# Patient Record
Sex: Female | Born: 1990
Health system: Southern US, Community
[De-identification: ages and names within clinical notes are randomized; demographics above are authoritative.]

## PROBLEM LIST (undated history)

## (undated) DIAGNOSIS — E010 Iodine-deficiency related diffuse (endemic) goiter: Secondary | ICD-10-CM

## (undated) HISTORY — DX: Iodine-deficiency related diffuse (endemic) goiter: E01.0

## (undated) HISTORY — PX: KNEE CARTILAGE SURGERY: SHX688

---

## 2011-05-28 ENCOUNTER — Encounter: Payer: Self-pay | Admitting: *Deleted

## 2011-05-28 ENCOUNTER — Emergency Department (HOSPITAL_COMMUNITY)
Admission: EM | Admit: 2011-05-28 | Discharge: 2011-05-28 | Disposition: A | Discharge status: Home / Self Care | Attending: Emergency Medicine | Admitting: Emergency Medicine

## 2011-05-28 DIAGNOSIS — F1010 Alcohol abuse, uncomplicated: Secondary | ICD-10-CM | POA: Insufficient documentation

## 2011-05-28 DIAGNOSIS — F10929 Alcohol use, unspecified with intoxication, unspecified: Secondary | ICD-10-CM

## 2011-05-28 DIAGNOSIS — R112 Nausea with vomiting, unspecified: Secondary | ICD-10-CM | POA: Insufficient documentation

## 2011-05-28 LAB — URINALYSIS, ROUTINE W REFLEX MICROSCOPIC
Glucose, UA: NEGATIVE mg/dL
Leukocytes, UA: NEGATIVE
Nitrite: NEGATIVE
Specific Gravity, Urine: 1.019 (ref 1.005–1.030)
pH: 6 (ref 5.0–8.0)

## 2011-05-28 LAB — POCT I-STAT, CHEM 8
Chloride: 113 mEq/L — ABNORMAL HIGH (ref 96–112)
Glucose, Bld: 81 mg/dL (ref 70–99)
HCT: 34 % — ABNORMAL LOW (ref 36.0–46.0)
Hemoglobin: 11.6 g/dL — ABNORMAL LOW (ref 12.0–15.0)
Potassium: 3.9 mEq/L (ref 3.5–5.1)
Sodium: 144 mEq/L (ref 135–145)

## 2011-05-28 LAB — POCT PREGNANCY, URINE: Preg Test, Ur: NEGATIVE

## 2011-05-28 MED ORDER — PROMETHAZINE HCL 25 MG PO TABS: 25.0000 mg | ORAL_TABLET | Freq: Three times a day (TID) | ORAL | Status: AC | PRN

## 2011-05-28 MED ORDER — ONDANSETRON 8 MG PO TBDP
8.0000 mg | ORAL_TABLET | Freq: Once | ORAL | Status: AC
  Administered 2011-05-28: 8 mg via ORAL
  Filled 2011-05-28: qty 1

## 2011-05-28 MED ORDER — ONDANSETRON HCL 4 MG/2ML IJ SOLN
4.0000 mg | Freq: Once | INTRAMUSCULAR | Status: AC
  Administered 2011-05-28: 4 mg via INTRAVENOUS
  Filled 2011-05-28: qty 2

## 2011-05-28 MED ORDER — SODIUM CHLORIDE 0.9 % IV BOLUS (SEPSIS)
1000.0000 mL | Freq: Once | INTRAVENOUS | Status: AC
  Administered 2011-05-28: 1000 mL via INTRAVENOUS

## 2011-05-28 NOTE — ED Notes (Signed)
Bathing Supplies Were Given 

## 2011-05-28 NOTE — ED Notes (Signed)
WJX:[id number]<WG> Expected date:05/28/11<BR> Expected time:12:48 AM<BR> Means of arrival:Ambulance<BR> Comments:<BR> etoh

## 2011-05-28 NOTE — ED Provider Notes (Signed)
History     CSN: [id number] Arrival date & time: 05/28/2011  1:16 AM   First MD Initiated Contact with Patient 05/28/11 [phone]      Chief Complaint  Patient presents with  . Nausea    pt recieved 4 zofran per ems.     (Consider location/radiation/quality/duration/timing/severity/associated sxs/prior treatment) The history is provided by the patient.   patient comes in after alcohol intoxication on her 20st birthday.  She began to have some nausea and vomiting after heavy alcohol consumption.  Patient has no complaints other than the nausea and vomiting.  She denies abdominal pain, shortness of breath, chest pain, headache, visual changes, or weakness.  History reviewed. No pertinent past medical history.  Past Surgical History  Procedure Date  . Knee     History reviewed. No pertinent family history.  History  Substance Use Topics  . Smoking status: Not on file  . Smokeless tobacco: Not on file  . Alcohol Use: Yes    OB History    Grav Para Term Preterm Abortions TAB SAB Ect Mult Living                  Review of Systems All pertinent positives and negatives in the history of present illness  Allergies  Review of patient's allergies indicates no known allergies.  Home Medications   Current Outpatient Rx  Name Route Sig Dispense Refill  . NORETHINDRONE-ETH ESTRADIOL 1-35 MG-MCG PO TABS Oral Take 1 tablet by mouth daily.        BP 88/41  Pulse 90  Temp(Src) 97.5 F (36.4 C) (Oral)  Resp 15  Ht 5\' 6"  (1.676 m)  Wt 150 lb (68.04 kg)  BMI 24.21 kg/m2  SpO2 98%  Physical Exam  Constitutional: She appears well-developed and well-nourished. No distress.  HENT:  Head: Normocephalic and atraumatic.  Eyes: Pupils are equal, round, and reactive to light.  Neck: Normal range of motion. Neck supple.  Cardiovascular: Normal rate, regular rhythm and normal heart sounds.  Exam reveals no gallop and no friction rub.   No murmur heard. Pulmonary/Chest: Effort normal  and breath sounds normal. No respiratory distress. She has no wheezes. She has no rales.  Abdominal: Soft. Bowel sounds are normal.  Neurological: She is alert.  Skin: Skin is warm and dry. No rash noted. No erythema.    ED Course  Procedures (including critical care time)     Patient has been ambulating around the department and feeling better.  She will be given antiemetics for home.  He is advised to increase her fluids with Gatorade type liquids.      MDM   Alcohol intoxication, nausea, and vomiting.       Carlyle Dolly, [place] 05/28/11 [phone]

## 2011-05-28 NOTE — ED Provider Notes (Signed)
Medical screening examination/treatment/procedure(s) were performed by non-physician practitioner and as supervising physician I was immediately available for consultation/collaboration.  Cydney Alvarenga M Trust Leh, MD 05/28/11 1744 

## 2011-05-28 NOTE — ED Notes (Signed)
Pt c/o N/V starting at 2300 last pm, last vomit was approx 0030, vomit x4-5, pt turned 20 yesterday and drank an unknown amt of etoh, pt sts "I never drink."

## 2011-05-28 NOTE — ED Notes (Signed)
Pt taken to room 2 and made comfortable.

## 2011-10-06 ENCOUNTER — Ambulatory Visit (INDEPENDENT_AMBULATORY_CARE_PROVIDER_SITE_OTHER): Admitting: Family Medicine

## 2011-10-06 VITALS — BP 121/79 | HR 76 | Temp 98.3°F | Resp 16 | Ht 66.5 in | Wt 161.0 lb

## 2011-10-06 DIAGNOSIS — IMO0002 Reserved for concepts with insufficient information to code with codable children: Secondary | ICD-10-CM

## 2011-10-06 DIAGNOSIS — L723 Sebaceous cyst: Secondary | ICD-10-CM

## 2011-10-06 MED ORDER — MELOXICAM 7.5 MG PO TABS: 7.5000 mg | ORAL_TABLET | Freq: Two times a day (BID) | ORAL | Status: AC

## 2011-10-06 NOTE — Progress Notes (Signed)
  Subjective:    Patient ID: Kendra Boone, female    DOB: 21-May-1991, 21 y.o.   MRN: [id number]  HPI Patient presents complaining of bump on scalp after getting hair cut on Saturday. Skin over bump tender and patient experiences twinge in her head when she presses on bump   Review of Systems     Objective:   Physical Exam  Constitutional: She appears well-developed.  Neck: Neck supple.  Cardiovascular: Normal rate, regular rhythm and normal heart sounds.   Pulmonary/Chest: Effort normal and breath sounds normal.  Skin:       Small fluctuant nonadherent lesion (L) parietal region          Assessment & Plan:  . 1. Simple cyst of scalp  meloxicam (MOBIC) 7.5 MG tablet

## 2015-08-09 ENCOUNTER — Ambulatory Visit (INDEPENDENT_AMBULATORY_CARE_PROVIDER_SITE_OTHER): Payer: Managed Care, Other (non HMO) | Admitting: Physician Assistant

## 2015-08-09 ENCOUNTER — Encounter: Payer: Self-pay | Admitting: Physician Assistant

## 2015-08-09 VITALS — BP 135/67 | HR 78 | Ht 66.0 in | Wt 179.0 lb

## 2015-08-09 DIAGNOSIS — E049 Nontoxic goiter, unspecified: Secondary | ICD-10-CM

## 2015-08-09 DIAGNOSIS — Z23 Encounter for immunization: Secondary | ICD-10-CM | POA: Diagnosis not present

## 2015-08-09 DIAGNOSIS — M25552 Pain in left hip: Secondary | ICD-10-CM

## 2015-08-09 DIAGNOSIS — E010 Iodine-deficiency related diffuse (endemic) goiter: Secondary | ICD-10-CM

## 2015-08-09 DIAGNOSIS — R1310 Dysphagia, unspecified: Secondary | ICD-10-CM

## 2015-08-09 DIAGNOSIS — M25562 Pain in left knee: Secondary | ICD-10-CM

## 2015-08-09 DIAGNOSIS — N9489 Other specified conditions associated with female genital organs and menstrual cycle: Secondary | ICD-10-CM | POA: Diagnosis not present

## 2015-08-09 DIAGNOSIS — M25561 Pain in right knee: Secondary | ICD-10-CM

## 2015-08-09 DIAGNOSIS — F32A Depression, unspecified: Secondary | ICD-10-CM

## 2015-08-09 DIAGNOSIS — N898 Other specified noninflammatory disorders of vagina: Secondary | ICD-10-CM

## 2015-08-09 DIAGNOSIS — R635 Abnormal weight gain: Secondary | ICD-10-CM

## 2015-08-09 DIAGNOSIS — F329 Major depressive disorder, single episode, unspecified: Secondary | ICD-10-CM | POA: Diagnosis not present

## 2015-08-09 DIAGNOSIS — R601 Generalized edema: Secondary | ICD-10-CM

## 2015-08-09 DIAGNOSIS — R682 Dry mouth, unspecified: Secondary | ICD-10-CM

## 2015-08-09 DIAGNOSIS — M25551 Pain in right hip: Secondary | ICD-10-CM

## 2015-08-09 LAB — WET PREP FOR TRICH, YEAST, CLUE
Trich, Wet Prep: NONE SEEN
Yeast Wet Prep HPF POC: NONE SEEN

## 2015-08-10 LAB — FERRITIN: FERRITIN: 23 ng/mL (ref 10–154)

## 2015-08-10 LAB — T4, FREE: Free T4: 1.2 ng/dL (ref 0.8–1.8)

## 2015-08-10 LAB — COMPREHENSIVE METABOLIC PANEL
ALBUMIN: 3.9 g/dL (ref 3.6–5.1)
ALT: 12 U/L (ref 6–29)
AST: 15 U/L (ref 10–30)
Alkaline Phosphatase: 51 U/L (ref 33–115)
BILIRUBIN TOTAL: 0.4 mg/dL (ref 0.2–1.2)
BUN: 10 mg/dL (ref 7–25)
CALCIUM: 9.2 mg/dL (ref 8.6–10.2)
CHLORIDE: 102 mmol/L (ref 98–110)
CO2: 25 mmol/L (ref 20–31)
Creat: 0.75 mg/dL (ref 0.50–1.10)
Glucose, Bld: 79 mg/dL (ref 65–99)
Potassium: 3.5 mmol/L (ref 3.5–5.3)
Sodium: 134 mmol/L — ABNORMAL LOW (ref 135–146)
TOTAL PROTEIN: 7.3 g/dL (ref 6.1–8.1)

## 2015-08-10 LAB — CBC
HEMATOCRIT: 41.2 % (ref 36.0–46.0)
HEMOGLOBIN: 13.8 g/dL (ref 12.0–15.0)
MCH: 30.6 pg (ref 26.0–34.0)
MCHC: 33.5 g/dL (ref 30.0–36.0)
MCV: 91.4 fL (ref 78.0–100.0)
MPV: 13 fL — AB (ref 8.6–12.4)
Platelets: 186 10*3/uL (ref 150–400)
RBC: 4.51 MIL/uL (ref 3.87–5.11)
RDW: 13.7 % (ref 11.5–15.5)
WBC: 6.2 10*3/uL (ref 4.0–10.5)

## 2015-08-10 LAB — T3, FREE: T3, Free: 3.3 pg/mL (ref 2.3–4.2)

## 2015-08-10 LAB — VITAMIN D 25 HYDROXY (VIT D DEFICIENCY, FRACTURES): VIT D 25 HYDROXY: 10 ng/mL — AB (ref 30–100)

## 2015-08-10 LAB — VITAMIN B12: VITAMIN B 12: 241 pg/mL (ref 200–1100)

## 2015-08-10 LAB — C-REACTIVE PROTEIN

## 2015-08-10 LAB — TSH: TSH: 0.73 mIU/L

## 2015-08-10 LAB — SEDIMENTATION RATE: SED RATE: 7 mm/h (ref 0–20)

## 2015-08-12 ENCOUNTER — Encounter: Payer: Self-pay | Admitting: Physician Assistant

## 2015-08-12 DIAGNOSIS — F329 Major depressive disorder, single episode, unspecified: Secondary | ICD-10-CM | POA: Insufficient documentation

## 2015-08-12 DIAGNOSIS — R635 Abnormal weight gain: Secondary | ICD-10-CM | POA: Insufficient documentation

## 2015-08-12 DIAGNOSIS — M25561 Pain in right knee: Secondary | ICD-10-CM | POA: Insufficient documentation

## 2015-08-12 DIAGNOSIS — R1310 Dysphagia, unspecified: Secondary | ICD-10-CM | POA: Insufficient documentation

## 2015-08-12 DIAGNOSIS — E010 Iodine-deficiency related diffuse (endemic) goiter: Secondary | ICD-10-CM | POA: Insufficient documentation

## 2015-08-12 DIAGNOSIS — N898 Other specified noninflammatory disorders of vagina: Secondary | ICD-10-CM | POA: Insufficient documentation

## 2015-08-12 DIAGNOSIS — E871 Hypo-osmolality and hyponatremia: Secondary | ICD-10-CM | POA: Insufficient documentation

## 2015-08-12 DIAGNOSIS — R601 Generalized edema: Secondary | ICD-10-CM | POA: Insufficient documentation

## 2015-08-12 DIAGNOSIS — R682 Dry mouth, unspecified: Secondary | ICD-10-CM | POA: Insufficient documentation

## 2015-08-12 DIAGNOSIS — E559 Vitamin D deficiency, unspecified: Secondary | ICD-10-CM | POA: Insufficient documentation

## 2015-08-12 DIAGNOSIS — F32A Depression, unspecified: Secondary | ICD-10-CM | POA: Insufficient documentation

## 2015-08-12 DIAGNOSIS — R790 Abnormal level of blood mineral: Secondary | ICD-10-CM | POA: Insufficient documentation

## 2015-08-12 DIAGNOSIS — M25551 Pain in right hip: Secondary | ICD-10-CM | POA: Insufficient documentation

## 2015-08-12 DIAGNOSIS — M25552 Pain in left hip: Secondary | ICD-10-CM

## 2015-08-12 DIAGNOSIS — M25562 Pain in left knee: Secondary | ICD-10-CM | POA: Insufficient documentation

## 2015-08-12 LAB — THYROID ANTIBODIES: Thyroperoxidase Ab SerPl-aCnc: 1 IU/mL (ref <9)

## 2015-08-12 MED ORDER — METRONIDAZOLE 500 MG PO TABS: 500.0000 mg | ORAL_TABLET | Freq: Two times a day (BID) | ORAL | Status: DC

## 2015-08-12 MED ORDER — VITAMIN D (ERGOCALCIFEROL) 1.25 MG (50000 UNIT) PO CAPS: 50000.0000 [IU] | ORAL_CAPSULE | ORAL | Status: DC

## 2015-08-12 NOTE — Progress Notes (Signed)
   Subjective:    Patient ID: Kendra Boone, female    DOB: [birth year], 25 y.o.   MRN: [id number]  HPI Pt is a 25 yo female who presents to the clinic to establish care.   PMH negative.  .. Family History  Problem Relation Age of Onset  . Hyperlipidemia Mother   . Hypertension Mother   . Alcohol abuse Father   . Hyperlipidemia Father   . Hypertension Father   . Diabetes Maternal Uncle   . Cancer Maternal Grandmother     ovarian  . Hyperlipidemia Maternal Grandmother   . Hypertension Maternal Grandmother   . Diabetes Maternal Grandfather   . Hypertension Maternal Grandfather   . Diabetes Maternal Uncle    .Marland Kitchen Social History   Social History  . Marital Status: Single    Spouse Name: N/A  . Number of Children: N/A  . Years of Education: N/A   Occupational History  . Not on file.   Social History Main Topics  . Smoking status: Never Smoker   . Smokeless tobacco: Not on file  . Alcohol Use: Yes  . Drug Use: No  . Sexual Activity: Yes   Other Topics Concern  . Not on file   Social History Narrative    Pt has multiple concerns today.  1. Vaginal odor. No real discharge. No itching, burning or pain. Not tried anything to make better.   2. Trouble swallowing for last 6 months. She is also having generalized hand and leg swelling, dry mouth, and depression. She just feels down. She has also gained 25 lbs in last year. Despite working out 3-5 times a week.   3. Bilateral hip pain/bilateral joint pain of both legs for last 6 months. Pain is off an on. Generalized achy feeling. She works out Production manager. That does not help or bother. She occasionally off and on has some leg and hand edema. No injuries or trauma.    Review of Systems  All other systems reviewed and are negative.      Objective:   Physical Exam  Constitutional: She is oriented to person, place, and time. She appears well-developed and well-nourished.  HENT:  Head: Normocephalic and atraumatic.   Right Ear: External ear normal.  Left Ear: External ear normal.  Nose: Nose normal.  Mouth/Throat: Oropharynx is clear and moist. No oropharyngeal exudate.  Eyes: Conjunctivae are normal. Right eye exhibits no discharge. Left eye exhibits no discharge.  Neck:  Diffusely large thyroid enlargement. Non-tender.   Cardiovascular: Normal rate, regular rhythm and normal heart sounds.   Pulmonary/Chest: Effort normal and breath sounds normal.  Neurological: She is alert and oriented to person, place, and time.  Skin: Skin is dry.  Psychiatric: She has a normal mood and affect. Her behavior is normal.          Assessment & Plan:  Thyromegaly/trouble swallowing/dry mouth/generalized swelling- TSH/T4/T3/thyroid antibodies. Will order ultrasound to further evaluate.  Anxiety and Depression- PHQ-9 was 13. GAD-7 was 6. Discussed medication options. She would like to hold until we get thyroid evaluated to see if that could be causing.   Fatigue/abnormal weight gain- fatigue panel ordered. implanon can cause some weight gain. Will follow up.   Bilateral hip pain/arthragia of lower legs- could be due to thyroid issue. Could be some osteoarthritis. Ibuprofen as needed. Will re-evaluate after labs.   Vaginal odor- wet prep stat sent. Positive for clue cells. Sent metronidazole for 7 days.

## 2015-08-15 ENCOUNTER — Ambulatory Visit (INDEPENDENT_AMBULATORY_CARE_PROVIDER_SITE_OTHER): Payer: Managed Care, Other (non HMO)

## 2015-08-15 DIAGNOSIS — E010 Iodine-deficiency related diffuse (endemic) goiter: Secondary | ICD-10-CM

## 2015-08-19 NOTE — Addendum Note (Signed)
Addended by: Donella Stade on: 08/19/2015 04:20 PM   Modules accepted: Orders

## 2015-08-20 ENCOUNTER — Encounter: Payer: Self-pay | Admitting: Internal Medicine

## 2015-10-08 ENCOUNTER — Ambulatory Visit: Payer: Managed Care, Other (non HMO) | Admitting: Internal Medicine

## 2015-11-14 ENCOUNTER — Other Ambulatory Visit: Payer: Self-pay | Admitting: Physician Assistant

## 2015-11-29 ENCOUNTER — Ambulatory Visit (INDEPENDENT_AMBULATORY_CARE_PROVIDER_SITE_OTHER): Payer: Managed Care, Other (non HMO) | Admitting: Internal Medicine

## 2015-11-29 ENCOUNTER — Encounter: Payer: Self-pay | Admitting: Internal Medicine

## 2015-11-29 VITALS — BP 114/70 | HR 76 | Ht 67.0 in | Wt 181.4 lb

## 2015-11-29 DIAGNOSIS — R194 Change in bowel habit: Secondary | ICD-10-CM

## 2015-11-29 DIAGNOSIS — R1310 Dysphagia, unspecified: Secondary | ICD-10-CM

## 2015-11-29 DIAGNOSIS — R140 Abdominal distension (gaseous): Secondary | ICD-10-CM

## 2015-11-29 NOTE — Patient Instructions (Signed)
  You have been scheduled for a Barium Esophogram at [facility] (1st floor of the hospital) on 12/03/15 at 10:00AM. Please arrive 15 minutes prior to your appointment for registration. Make certain not to have anything to eat or drink 3 hours prior to your test. If you need to reschedule for any reason, please contact radiology at [phone] to do so. __________________________________________________________________ A barium swallow is an examination that concentrates on views of the esophagus. This tends to be a double contrast exam (barium and two liquids which, when combined, create a gas to distend the wall of the oesophagus) or single contrast (non-ionic iodine based). The study is usually tailored to your symptoms so a good history is essential. Attention is paid during the study to the form, structure and configuration of the esophagus, looking for functional disorders (such as aspiration, dysphagia, achalasia, motility and reflux) EXAMINATION You may be asked to change into a gown, depending on the type of swallow being performed. A radiologist and radiographer will perform the procedure. The radiologist will advise you of the type of contrast selected for your procedure and direct you during the exam. You will be asked to stand, sit or lie in several different positions and to hold a small amount of fluid in your mouth before being asked to swallow while the imaging is performed .In some instances you may be asked to swallow barium coated marshmallows to assess the motility of a solid food bolus. The exam can be recorded as a digital or video fluoroscopy procedure. POST PROCEDURE It will take 1-2 days for the barium to pass through your system. To facilitate this, it is important, unless otherwise directed, to increase your fluids for the next 24-48hrs and to resume your normal diet.  This test typically takes about 30 minutes to  perform. __________________________________________________________________________________   I appreciate the opportunity to care for you. Silvano Rusk, MD, [facility]

## 2015-11-29 NOTE — Progress Notes (Addendum)
Referred by Iran Planas PA-C Subjective:    Patient ID: Kendra Boone, female    DOB: 1991/06/11, 25 y.o.   MRN: [id number] Cc: swallowing problems HPI  9-10  months of swallowing problems first reported 07/2015 at PCP initial visit - at that time 6 mos hx Hx of neck swelling - ? Of thyroid problems Having intermittent solid and liquid dysphagia No unintentional weight loss No sig reflux sxs  Also has c/o boating and gas, softer stools lately All other GI ROS negative  No Known Allergies Outpatient Prescriptions Prior to Visit  Medication Sig Dispense Refill  . Vitamin D, Ergocalciferol, (DRISDOL) 50000 units CAPS capsule Take 1 capsule (50,000 Units total) by mouth every 7 (seven) days. 12 capsule 0  . metroNIDAZOLE (FLAGYL) 500 MG tablet Take 1 tablet (500 mg total) by mouth 2 (two) times daily. For 7 days for Bacterial Vaginosis. 14 tablet 0   No facility-administered medications prior to visit.   History reviewed. No pertinent past medical history. Past Surgical History  Procedure Laterality Date  . Knee cartilage surgery Left    Social History   Social History  . Marital Status: Single    Spouse Name: N/A  . Number of Children: 0  . Years of Education: N/A   Occupational History  . Teacher, English as a foreign language    Social History Main Topics  . Smoking status: Never Smoker   . Smokeless tobacco: Never Used  . Alcohol Use: 4.2 oz/week    7 Standard drinks or equivalent per week     Comment: 1 per day  . Drug Use: No  . Sexual Activity: Yes   Other Topics Concern  . 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 Hyperlipidemia Mother   . Hypertension Mother   . Alcohol abuse Father   . Hyperlipidemia Father   . Hypertension Father   . Diabetes Maternal Uncle     x 2  . Ovarian cancer Maternal Grandmother   . Hyperlipidemia Maternal Grandmother   . Hypertension Maternal  Grandmother   . Diabetes Maternal Grandfather   . Hypertension Maternal Grandfather      Review of Systems + anxiety/depressiions sxs, hip pains and other joint pains, also has dry mouth All other ROS negative or per HPI    Objective:   Physical Exam @BP  114/70 mmHg  Pulse 76  Ht 5\' 7"  (1.702 m)  Wt 181 lb 6 oz (82.271 kg)  BMI 28.40 kg/m2  LMP 11/25/2015@  General:  Well-developed, well-nourished and in no acute distress Eyes:  anicteric. ENT:   Mouth and posterior pharynx free of lesions.  Neck:   supple w/o thyromegaly or mass. The neck has a full appearance - ? Some redundant tissue Lungs: Clear to auscultation bilaterally. Heart:  S1S2, no rubs, murmurs, gallops. Abdomen:  soft, non-tender, no hepatosplenomegaly, hernia, or mass and BS+. Lymph:  no cervical or supraclavicular adenopathy. Extremities:   no edema, cyanosis or clubbing Skin   no rash. Neuro:  A&O x 3.  Psych:  appropriate mood and  Affect.   Data Reviewed:  PCP notes 2017 Neck US2/16/17 NL  NL CBC and TSH 2/17, also NL CRP and sed rate + anxiety and depression scale Wt Readings from Last 3 Encounters:  11/29/15 181 lb 6 oz (82.271 kg)  08/09/15 179 lb (81.194 kg)  10/06/11 161 lb (73.029 kg)  Assessment & Plan:   Encounter Diagnoses  Name Primary?  Marland Kitchen Dysphagia Yes  . Bloating   . Change in bowel habits - soft stools    Cause not clear though we know she does not have thyromegaly Evaluate first with Ba swallow May need EGD and/or manometry. Dry mouth and joint pains raise ? Autoimmune process   Sounds like she may also have IBS - await Ba swallow -  Ba swallow showed transient hang-up of 13 mm tablet mid esophagus but NL motility  Will recommend an EGD I appreciate the opportunity to care for this patient.  [id number] [place], PA-C

## 2015-12-03 ENCOUNTER — Ambulatory Visit (HOSPITAL_COMMUNITY)
Admission: RE | Admit: 2015-12-03 | Discharge: 2015-12-03 | Disposition: A | Discharge status: Home / Self Care | Payer: Managed Care, Other (non HMO) | Source: Ambulatory Visit | Attending: Internal Medicine | Admitting: Internal Medicine

## 2015-12-03 DIAGNOSIS — R1310 Dysphagia, unspecified: Secondary | ICD-10-CM | POA: Insufficient documentation

## 2015-12-09 ENCOUNTER — Telehealth: Payer: Self-pay | Admitting: Internal Medicine

## 2015-12-09 ENCOUNTER — Encounter: Payer: Self-pay | Admitting: Internal Medicine

## 2015-12-09 NOTE — Progress Notes (Signed)
Quick Note:  Tablet lodged briefly in mid-esophagus Next step is EGD and possible dilation as discussed at office Please schedule if she is willing ______

## 2015-12-09 NOTE — Telephone Encounter (Signed)
See results notes on BS for additional details

## 2015-12-10 ENCOUNTER — Telehealth: Payer: Self-pay | Admitting: Internal Medicine

## 2015-12-10 NOTE — Telephone Encounter (Signed)
All questions answered about upcoming EGD.  She will call back for any additional questions or concerns.

## 2015-12-13 ENCOUNTER — Encounter: Payer: Self-pay | Admitting: Internal Medicine

## 2016-01-03 ENCOUNTER — Encounter: Payer: Managed Care, Other (non HMO) | Admitting: Internal Medicine

## 2016-01-30 ENCOUNTER — Encounter: Payer: Self-pay | Admitting: Internal Medicine

## 2016-01-30 ENCOUNTER — Ambulatory Visit (AMBULATORY_SURGERY_CENTER): Payer: Self-pay | Admitting: *Deleted

## 2016-01-30 ENCOUNTER — Encounter (INDEPENDENT_AMBULATORY_CARE_PROVIDER_SITE_OTHER): Payer: Self-pay

## 2016-01-30 VITALS — Ht 66.0 in | Wt 187.0 lb

## 2016-01-30 DIAGNOSIS — R1310 Dysphagia, unspecified: Secondary | ICD-10-CM

## 2016-01-30 NOTE — Progress Notes (Signed)
No allergies to eggs or soy. No problems with anesthesia.  Pt given Emmi instructions for EGD  No oxygen use  No diet drug use  

## 2016-02-12 ENCOUNTER — Encounter: Payer: Self-pay | Admitting: Internal Medicine

## 2016-02-12 ENCOUNTER — Ambulatory Visit (AMBULATORY_SURGERY_CENTER): Payer: Managed Care, Other (non HMO) | Admitting: Internal Medicine

## 2016-02-12 VITALS — BP 107/63 | HR 73 | Temp 97.3°F | Resp 10 | Ht 67.0 in | Wt 181.0 lb

## 2016-02-12 DIAGNOSIS — K222 Esophageal obstruction: Secondary | ICD-10-CM | POA: Diagnosis not present

## 2016-02-12 DIAGNOSIS — D130 Benign neoplasm of esophagus: Secondary | ICD-10-CM

## 2016-02-12 DIAGNOSIS — R1310 Dysphagia, unspecified: Secondary | ICD-10-CM | POA: Diagnosis present

## 2016-02-12 DIAGNOSIS — K229 Disease of esophagus, unspecified: Secondary | ICD-10-CM | POA: Diagnosis not present

## 2016-02-12 MED ORDER — SODIUM CHLORIDE 0.9 % IV SOLN: 500.0000 mL | INTRAVENOUS | Status: DC

## 2016-02-12 NOTE — Progress Notes (Signed)
Report to PACU, RN, vss, BBS= Clear.  

## 2016-02-12 NOTE — Op Note (Signed)
[place] Patient Name: Kendra Boone Procedure Date: 02/12/2016 9:50 AM MRN: [id number] Endoscopist: Gatha Mayer , MD Age: 25 Referring MD:  Date of Birth: 01/04/1991 Gender: Female Account #: [id number] Procedure:                Upper GI endoscopy Indications:              Dysphagia, Abnormal cine-esophagram Medicines:                Propofol per Anesthesia, Monitored Anesthesia Care Procedure:                Pre-Anesthesia Assessment:                           - Prior to the procedure, a History and Physical                            was performed, and patient medications and                            allergies were reviewed. The patient's tolerance of                            previous anesthesia was also reviewed. The risks                            and benefits of the procedure and the sedation                            options and risks were discussed with the patient.                            All questions were answered, and informed consent                            was obtained. Prior Anticoagulants: The patient has                            taken no previous anticoagulant or antiplatelet                            agents. ASA Grade Assessment: II - A patient with                            mild systemic disease. After reviewing the risks                            and benefits, the patient was deemed in                            satisfactory condition to undergo the procedure.                           After obtaining informed consent, the endoscope was  passed under direct vision. Throughout the                            procedure, the patient's blood pressure, pulse, and                            oxygen saturations were monitored continuously. The                            Model GIF-[id number]) scope was introduced                            through the mouth, and advanced to the second part            of duodenum. The upper GI endoscopy was                            accomplished without difficulty. The patient                            tolerated the procedure well. Scope In: Scope Out: Findings:                 One mild benign-appearing, intrinsic stenosis was                            found at the gastroesophageal junction. And was                            traversed. A TTS dilator was passed through the                            scope. Dilation with an 18-19-20 mm balloon dilator                            was performed to 20 mm. The dilation site was                            examined and showed mild improvement in luminal                            narrowing. Estimated blood loss: none.                           One 3 mm polyp with no bleeding was found 26 cm                            from the incisors. The polyp was removed with a                            cold biopsy forceps. Resection and retrieval were                            complete. Verification of patient identification  for the specimen was done. Estimated blood loss was                            minimal.                           The exam was otherwise without abnormality.                           The cardia and gastric fundus were normal on                            retroflexion. Complications:            No immediate complications. Estimated Blood Loss:     Estimated blood loss was minimal. Impression:               - Benign-appearing esophageal stenosis. Dilated.                           - Esophageal polyp(s) were found. Resected and                            retrieved.                           - The examination was otherwise normal. Recommendation:           - Patient has a contact number available for                            emergencies. The signs and symptoms of potential                            delayed complications were discussed with the                             patient. Return to normal activities tomorrow.                            Written discharge instructions were provided to the                            patient.                           - Clear liquids x 1 hour then soft foods rest of                            day. Start prior diet tomorrow.                           - Continue present medications.                           - Await pathology results.                           -  Consider adding a low-dose PPI - may hav reflux                            issues - will see what response to dilation is - a                            13 mm tablet stopped in distal esophagus on ba                            swallow Gatha Mayer, MD 02/12/2016 10:21:44 AM This report has been signed electronically.

## 2016-02-12 NOTE — Patient Instructions (Addendum)
   I dilated the esophagus - I also removed a small benign-appearing lesion from the esophagus.  Will call with results and plans.  I appreciate the opportunity to care for you. Gatha Mayer, MD, FACG YOU HAD AN ENDOSCOPIC PROCEDURE TODAY AT [facility]:   Refer to the procedure report that was given to you for any specific questions about what was found during the examination.  If the procedure report does not answer your questions, please call your gastroenterologist to clarify.  If you requested that your care partner not be given the details of your procedure findings, then the procedure report has been included in a sealed envelope for you to review at your convenience later.  YOU SHOULD EXPECT: Some feelings of bloating in the abdomen. Passage of more gas than usual.  Walking can help get rid of the air that was put into your GI tract during the procedure and reduce the bloating. If you had a lower endoscopy (such as a colonoscopy or flexible sigmoidoscopy) you may notice spotting of blood in your stool or on the toilet paper. If you underwent a bowel prep for your procedure, you may not have a normal bowel movement for a few days.  Please Note:  You might notice some irritation and congestion in your nose or some drainage.  This is from the oxygen used during your procedure.  There is no need for concern and it should clear up in a day or so.  SYMPTOMS TO REPORT IMMEDIATELY:    Following upper endoscopy (EGD)  Vomiting of blood or coffee ground material  New chest pain or pain under the shoulder blades  Painful or persistently difficult swallowing  New shortness of breath  Fever of 100F or higher  Black, tarry-looking stools  For urgent or emergent issues, a gastroenterologist can be reached at any hour by calling [phone].   DIET:  We do recommend a small meal at first, but then you may proceed to your regular diet.  Drink plenty of fluids but you  should avoid alcoholic beverages for 24 hours.  ACTIVITY:  You should plan to take it easy for the rest of today and you should NOT DRIVE or use heavy machinery until tomorrow (because of the sedation medicines used during the test).    FOLLOW UP: Our staff will call the number listed on your records the next business day following your procedure to check on you and address any questions or concerns that you may have regarding the information given to you following your procedure. If we do not reach you, we will leave a message.  However, if you are feeling well and you are not experiencing any problems, there is no need to return our call.  We will assume that you have returned to your regular daily activities without incident.  If any biopsies were taken you will be contacted by phone or by letter within the next 1-3 weeks.  Please call us at [phone] if you have not heard about the biopsies in 3 weeks.    SIGNATURES/CONFIDENTIALITY: You and/or your care partner have signed paperwork which will be entered into your electronic medical record.  These signatures attest to the fact that that the information above on your After Visit Summary has been reviewed and is understood.  Full responsibility of the confidentiality of this discharge information lies with you and/or your care-partner.  Post esophageal dilation diet handout provided. Esophagitis and stricture handout provided.

## 2016-02-12 NOTE — Progress Notes (Signed)
Called to room to assist during endoscopic procedure.  Patient ID and intended procedure confirmed with present staff. Received instructions for my participation in the procedure from the performing physician.  

## 2016-02-13 ENCOUNTER — Telehealth: Payer: Self-pay | Admitting: Internal Medicine

## 2016-02-13 ENCOUNTER — Telehealth: Payer: Self-pay

## 2016-02-13 NOTE — Telephone Encounter (Signed)
  Follow up Call-  Call back number 02/12/2016  Post procedure Call Back phone  # [phone]  Permission to leave phone message Yes  Some recent data might be hidden    Patient was called for follow up after her procedure on 02/12/2016. No answer at the number given for follow up phone call. A message was left on the answering machine.

## 2016-02-13 NOTE — Telephone Encounter (Signed)
Patient notified of recommendations. 

## 2016-02-13 NOTE — Telephone Encounter (Signed)
Patient reports that she has pain that started yesterday after the procedure.  Pain is about "in line" with umbilicus and on the left and the right.  Describes it as "shooting pain".  Worse if "i take in a deep breath".  She is tolerating a soft diet, no nausea or vomiting.  Please advise

## 2016-02-13 NOTE — Telephone Encounter (Signed)
I would give it time - do not think a problem from the procedure she had if it is should go away - things to look out for/call back would be fever, vomiting bleeding

## 2016-02-24 NOTE — Progress Notes (Signed)
Benign squamous papilloma removed from esophagus - probably from reflux - no follow-up endoscopy needed  Start omeparzole 20 mg qd before breakfast - OTC is ok to use  If she is not doing well in October on this she should return  [facility] no letter/recall

## 2017-02-10 ENCOUNTER — Ambulatory Visit (INDEPENDENT_AMBULATORY_CARE_PROVIDER_SITE_OTHER): Payer: 59

## 2017-02-10 ENCOUNTER — Encounter: Payer: Self-pay | Admitting: Physician Assistant

## 2017-02-10 ENCOUNTER — Ambulatory Visit (INDEPENDENT_AMBULATORY_CARE_PROVIDER_SITE_OTHER): Payer: 59 | Admitting: Physician Assistant

## 2017-02-10 VITALS — BP 114/75 | HR 65 | Wt 195.0 lb

## 2017-02-10 DIAGNOSIS — M7672 Peroneal tendinitis, left leg: Secondary | ICD-10-CM | POA: Diagnosis not present

## 2017-02-10 DIAGNOSIS — M25572 Pain in left ankle and joints of left foot: Secondary | ICD-10-CM

## 2017-02-10 DIAGNOSIS — M7989 Other specified soft tissue disorders: Secondary | ICD-10-CM

## 2017-02-10 DIAGNOSIS — M25472 Effusion, left ankle: Secondary | ICD-10-CM

## 2017-02-10 NOTE — Progress Notes (Signed)
   Subjective:    Patient ID: Kendra Boone, female    DOB: 1991-05-07, 26 y.o.   MRN: [id number]  HPI  Pt is a 26 yo female who complains of left ankle pain and swelling. 3 weeks ago she decided to start running. She started running 1 mile a day. 1 week into it she started having pain in her left lateral ankle with swelling. She does not remember any injury or twist. Swelling is worst at the end of the day. She has tried to elevate, heat, ibuprofen. She is beginning not to be able to walk on it. She rates pain 7/10 with weight bearing.    Review of Systems See HPI.     Objective:   Physical Exam  Constitutional: She is oriented to person, place, and time. She appears well-developed and well-nourished.  HENT:  Head: Normocephalic and atraumatic.  Musculoskeletal:  Left ankle mildly swollen nonpitting edema lateral side.  No tenderness over forefoot. Significant tenderness just above lateral malleolus and around posteriorly.  More pain with resisted dorsiflexion than plantarflexion.  Strength decreased significantly.  Not able to bear full weight due to pain.   Neurological: She is alert and oriented to person, place, and time.  Psychiatric: She has a normal mood and affect. Her behavior is normal.          Assessment & Plan:  Marland KitchenMarland KitchenDontrice was seen today for ankle pain.  Diagnoses and all orders for this visit:  Peroneal tendinitis of left lower extremity  Acute left ankle pain -     DG Foot Complete Left; Future -     DG Ankle Complete Left; Future -     DG Foot Complete Left -     DG Ankle Complete Left  Left ankle swelling -     DG Foot Complete Left; Future -     DG Ankle Complete Left; Future -     DG Foot Complete Left -     DG Ankle Complete Left     Xray of lateral foot and ankle showed mild soft tissue swelling over lateral malleolus. No evidence of fracture.   Suspect peroneal tendonitis.  Pt was given heel lift to place in tennis shoes. Encouraged to  wear good supportive shoes for next 2 weeks. Discussed PRICE method. Pt's ankle was ACE wrapped. Discussed can take ibuprofen 800mg  TID for next 2 weeks. NO running.  Follow up in 2 weeks.

## 2017-02-10 NOTE — Patient Instructions (Signed)
Peroneal Tendinopathy Rehab  Ask your health care provider which exercises are safe for you. Do exercises exactly as told by your health care provider and adjust them as directed. It is normal to feel mild stretching, pulling, tightness, or discomfort as you do these exercises, but you should stop right away if you feel sudden pain or your pain gets worse.Do not begin these exercises until told by your health care provider.  Stretching and range of motion exercises  These exercises warm up your muscles and joints and improve the movement and flexibility of your ankle. These exercises also help to relieve pain and stiffness.  Exercise A: Gastroc and soleus, standing  1. Stand on the edge of a step on the balls of your feet. The ball of your foot is on the walking surface, right under your toes.  2. Hold onto the railing for balance.  3. Slowly lift your left / right foot, allowing your body weight to press your left / right heel down over the edge of the step. You should feel a stretch in your left / right calf.  4. Hold this position for __________ seconds.  Repeat __________ times with your left / right knee straight and __________ times with your left / right knee bent. Complete this stretch __________ times per day.  Strengthening exercises  These exercises improve the strength and endurance of your foot and ankle. Endurance is the ability to use your muscles for a long time, even after they get tired.  Exercise B: Dorsiflexors    1. Secure a rubber exercise band or tube to an object, like a table leg, that will not move if it is pulled on.  2. Secure the other end of the band around your left / right foot.  3. Sit on the floor, facing the object with your left / right foot extended. The band or tube should be slightly tense when your foot is relaxed.  4. Slowly flex your left / right ankle and toes to bring your foot toward you.  5. Hold this position for __________ seconds.  6. Slowly return your foot to the  starting position.  Repeat __________ times. Complete this exercise __________ times per day.  Exercise C: Evertors  1. Sit on the floor with your legs straight out in front of you.  2. Loop a rubber exercise or band or tube around the ball of your left / right foot. The ball of your foot is on the walking surface, right under your toes.  3. Hold the ends of the band in your hands, or secure the band to a stable object.  4. Slowly push your foot outward, away from your other leg.  5. Hold this position for __________ seconds.  6. Slowly return your foot to the starting position.  Repeat __________ times. Complete this exercise __________ times per day.  Exercise D: Standing heel raise (  plantar flexion)  1. Stand with your feet shoulder-width apart with the balls of your feet on a step. The ball of your foot is on the walking surface, right under your toes.  2. Keep your weight spread evenly over the width of your feet while you rise up on your toes. Use a wall or railing to steady yourself, but try not to use it for support.  3. If this exercise is too easy, try these options:  ? Shift your weight toward your left / right leg until you feel challenged.  ? If told by   your health care provider, stand on your left / right leg only.  4. Hold this position for __________ seconds.  Repeat __________ times. Complete this exercise __________ times per day.  Exercise E: Single leg stand  1. Without shoes, stand near a railing or in a doorway. You may hold onto the railing or door frame as needed.  2. Stand on your left / right foot. Keep your big toe down on the floor and try to keep your arch lifted.  ? Do not roll to the outside of your foot.  ? If this exercise is too easy, you can try it with your eyes closed or while standing on a pillow.  3. Hold this position for __________ seconds.  Repeat __________ times. Complete this exercise __________ times per day.  This information is not intended to replace advice given to  you by your health care provider. Make sure you discuss any questions you have with your health care provider.  Document Released: 06/15/2005 Document Revised: 02/20/2016 Document Reviewed: 05/04/2015  Elsevier Interactive Patient Education  2018 [employer]

## 2017-02-13 ENCOUNTER — Encounter: Payer: Self-pay | Admitting: Physician Assistant

## 2017-02-13 DIAGNOSIS — M7672 Peroneal tendinitis, left leg: Secondary | ICD-10-CM | POA: Insufficient documentation

## 2017-02-24 ENCOUNTER — Encounter: Payer: Self-pay | Admitting: Physician Assistant

## 2017-02-24 ENCOUNTER — Ambulatory Visit (INDEPENDENT_AMBULATORY_CARE_PROVIDER_SITE_OTHER): Payer: 59 | Admitting: Physician Assistant

## 2017-02-24 VITALS — BP 119/74 | HR 85 | Wt 190.0 lb

## 2017-02-24 DIAGNOSIS — Z23 Encounter for immunization: Secondary | ICD-10-CM | POA: Diagnosis not present

## 2017-02-24 DIAGNOSIS — M7672 Peroneal tendinitis, left leg: Secondary | ICD-10-CM

## 2017-02-24 NOTE — Progress Notes (Signed)
   Subjective:    Patient ID: Kendra Boone, female    DOB: [birth year], 26 y.o.   MRN: [id number]  HPI Pt is a 26 yo female who presents to the clinic for 2 week follow up on left peroneal tendonitis. She is doing much better. She rates pain 1/10 today and not all the time just with certain movements. She wears ace wrap and brace only when working out. She has not started back running at this point. She is not taking aleve anymore. She can bear weight today without pain.   .. Active Ambulatory Problems    Diagnosis Date Noted  . Vitamin D deficiency 08/12/2015  . Serum sodium decreased 08/12/2015  . Low iron stores 08/12/2015  . Thyromegaly 08/12/2015  . Trouble swallowing 08/12/2015  . Dry mouth 08/12/2015  . Generalized edema 08/12/2015  . Bilateral hip pain 08/12/2015  . Abnormal weight gain 08/12/2015  . Depression 08/12/2015  . Arthralgia of both lower legs 08/12/2015  . Vaginal odor 08/12/2015  . Peroneal tendinitis of left lower extremity 02/13/2017   Resolved Ambulatory Problems    Diagnosis Date Noted  . No Resolved Ambulatory Problems   No Additional Past Medical History      Review of Systems  All other systems reviewed and are negative.      Objective:   Physical Exam  Constitutional: She appears well-developed and well-nourished.  Musculoskeletal:  Full ROM of left ankle without pain.  No pain with resisted dorsi and plantar flexion.  No swelling or erythema noted.  There was still some point tenderness just superior to the lateral malleolus to palpation.           Assessment & Plan:  Marland KitchenMarland KitchenDontrice was seen today for ankle pain.  Diagnoses and all orders for this visit:  Peroneal tendinitis of left lower extremity  Need for influenza vaccination -     Flu Vaccine QUAD 6+ mos PF IM (Fluarix Quad PF)   Pt is recovering well. I would still not run for next 2 weeks and then gradually add back in walking first before going to running. Continue to  work out doing low impact things. NSAIDS as needed. Ok to use heel lifts in tennis shoes and keep ankle wrapped during exercise. Follow up as needed.

## 2017-10-11 ENCOUNTER — Encounter: Payer: Self-pay | Admitting: Physician Assistant

## 2017-10-11 ENCOUNTER — Ambulatory Visit: Payer: 59 | Admitting: Physician Assistant

## 2017-10-11 VITALS — BP 112/64 | HR 86 | Ht 67.0 in | Wt 190.0 lb

## 2017-10-11 DIAGNOSIS — E010 Iodine-deficiency related diffuse (endemic) goiter: Secondary | ICD-10-CM | POA: Diagnosis not present

## 2017-10-11 DIAGNOSIS — R002 Palpitations: Secondary | ICD-10-CM

## 2017-10-11 NOTE — Progress Notes (Signed)
Subjective:    Patient ID: Kendra Boone, female    DOB: 10/04/1990, 27 y.o.   MRN: [id number]  HPI Kendra Boone presents today with a main concern regarding palpitations. She states that over the past 2 months she has been feeling like her heart is skipping a beat. She says she feels it skip a beat, then she feels her heart pound, then she will feel short of breath. She states that she has associated shortness of breath with it but denies any chest pain, headaches, visual changes. She does admit to headaches for years but nothing has changed, they aren't worse and do not seem to be related to the palpitations. The palpitations occur while at rest and with activity. She states that in the past she has had providers check her TSH and thyroid U/S and things have come back normal.   She has not taken any new medications or anything over the counter. She drinks ~1 cup coffee every other day. She admits to some lower extremity edema but denies any heat/cold intolerance  .Marland Kitchen Active Ambulatory Problems    Diagnosis Date Noted  . Vitamin D deficiency 08/12/2015  . Serum sodium decreased 08/12/2015  . Low iron stores 08/12/2015  . Thyromegaly 08/12/2015  . Trouble swallowing 08/12/2015  . Dry mouth 08/12/2015  . Generalized edema 08/12/2015  . Bilateral hip pain 08/12/2015  . Abnormal weight gain 08/12/2015  . Depression 08/12/2015  . Arthralgia of both lower legs 08/12/2015  . Vaginal odor 08/12/2015  . Peroneal tendinitis of left lower extremity 02/13/2017   Resolved Ambulatory Problems    Diagnosis Date Noted  . No Resolved Ambulatory Problems   No Additional Past Medical History      Review of Systems  Constitutional: Negative for activity change, appetite change, chills, fatigue, fever and unexpected weight change.  Eyes: Negative for visual disturbance.  Respiratory: Positive for shortness of breath. Negative for cough.   Cardiovascular: Positive for palpitations and leg swelling.  Negative for chest pain.  Endocrine: Negative for cold intolerance and heat intolerance.  Musculoskeletal: Negative for arthralgias and myalgias.  Skin: Negative for color change and rash.  Neurological: Negative for dizziness, light-headedness, numbness and headaches.       Objective:   Physical Exam  Constitutional: She is oriented to person, place, and time. She appears well-developed and well-nourished. No distress.  HENT:  Head: Normocephalic and atraumatic.  Eyes: Conjunctivae are normal.  Neck: Normal range of motion. Neck supple. Thyromegaly present.  Cardiovascular: Normal rate, regular rhythm, normal heart sounds and intact distal pulses.  No murmur heard. Pulmonary/Chest: Effort normal and breath sounds normal. No respiratory distress. She has no wheezes.  Musculoskeletal: Normal range of motion.  Neurological: She is alert and oriented to person, place, and time.  Skin: Skin is warm and dry.  Psychiatric: She has a normal mood and affect. Her behavior is normal.          Assessment & Plan:  Marland KitchenMarland KitchenDiagnoses and all orders for this visit:  Palpitations -     EKG 12-Lead -     CBC with Differential/Platelet -     COMPLETE METABOLIC PANEL WITH GFR -     TSH -     Thyroid Peroxidase Antibody -     Thyroglobulin Antibodies (Refl) -     Thyrotropin receptor autoabs -     T4, free -     T3, free -     US THYROID  Thyromegaly -  TSH -     Thyroid Peroxidase Antibody -     Thyroglobulin Antibodies (Refl) -     Thyrotropin receptor autoabs -     T4, free -     T3, free -     US THYROID   .Marland Kitchen Depression screen [facility] 2/9 10/11/2017 02/24/2017  Decreased Interest 1 1  Down, Depressed, Hopeless 1 1  PHQ - 2 Score 2 2  Altered sleeping 2 -  Tired, decreased energy 3 -  Change in appetite 1 -  Feeling bad or failure about yourself  0 -  Trouble concentrating 0 -  Moving slowly or fidgety/restless 0 -  Suicidal thoughts 0 -  PHQ-9 Score 8 -  Difficult doing  work/chores Somewhat difficult -   .. GAD 7 : Generalized Anxiety Score 10/11/2017  Nervous, Anxious, on Edge 1  Control/stop worrying 0  Worry too much - different things 1  Trouble relaxing 1  Restless 0  Easily annoyed or irritable 1  Afraid - awful might happen 0  Total GAD 7 Score 4  Anxiety Difficulty Somewhat difficult    EKG was done today in office and revealed normal sinus rhythm at 81 bpm there were no arrhythmias noted, no ST elevation or depression.  We are evaluating causes of recent increase in PVCs/palpitations.  I would like to do some blood work to evaluate thyroid, anemia.  Certainly there could be some outside triggers making this worse.  I encouraged patient to keep a diary of events and see if there are any links.  I did palpate a enlarged thyroid on exam today.  Thyroid ultrasound with extended panel of thyroid antibodies ordered today.  I will also get her set up with a Holter monitor.  I would like for patient to follow-up in the next 4 weeks.  Certainly she can follow-up sooner if symptoms worsen.  I did have patient do a PHQ 9 and a GAD in the office today.  I do not see any convincing evidence of anxiety or depression that could be increasing palpitations.

## 2017-10-11 NOTE — Patient Instructions (Addendum)
Will get u/s and holter monitor.   Palpitations A palpitation is the feeling that your heart:  Has an uneven (irregular) heartbeat.  Is beating faster than normal.  Is fluttering.  Is skipping a beat.  This is usually not a serious problem. In some cases, you may need more medical tests. Follow these instructions at home:  Avoid: ? Caffeine in coffee, tea, soft drinks, diet pills, and energy drinks. ? Chocolate. ? Alcohol.  Do not use any tobacco products. These include cigarettes, chewing tobacco, and e-cigarettes. If you need help quitting, ask your doctor.  Try to reduce your stress. These things may help: ? Yoga. ? Meditation. ? Physical activity. Swimming, jogging, and walking are good choices. ? A method that helps you use your mind to control things in your body, like heartbeats (biofeedback).  Get plenty of rest and sleep.  Take over-the-counter and prescription medicines only as told by your doctor.  Keep all follow-up visits as told by your doctor. This is important. Contact a doctor if:  Your heartbeat is still fast or uneven after 24 hours.  Your palpitations occur more often. Get help right away if:  You have chest pain.  You feel short of breath.  You have a very bad headache.  You feel dizzy.  You pass out (faint). This information is not intended to replace advice given to you by your health care provider. Make sure you discuss any questions you have with your health care provider. Document Released: 03/24/2008 Document Revised: 11/21/2015 Document Reviewed: 02/28/2015 Elsevier Interactive Patient Education  [employer].

## 2017-10-12 NOTE — Progress Notes (Signed)
Call pt: so labs look really good. No anemia. First thyroid labs look good. Will let you know as more labs come in.

## 2017-10-13 ENCOUNTER — Ambulatory Visit (INDEPENDENT_AMBULATORY_CARE_PROVIDER_SITE_OTHER): Payer: 59

## 2017-10-13 DIAGNOSIS — E010 Iodine-deficiency related diffuse (endemic) goiter: Secondary | ICD-10-CM | POA: Diagnosis not present

## 2017-10-13 DIAGNOSIS — R002 Palpitations: Secondary | ICD-10-CM

## 2017-10-13 LAB — COMPLETE METABOLIC PANEL WITH GFR
AG RATIO: 1.2 (calc) (ref 1.0–2.5)
ALBUMIN MSPROF: 4.1 g/dL (ref 3.6–5.1)
ALT: 16 U/L (ref 6–29)
AST: 16 U/L (ref 10–30)
Alkaline phosphatase (APISO): 66 U/L (ref 33–115)
BUN: 9 mg/dL (ref 7–25)
CHLORIDE: 106 mmol/L (ref 98–110)
CO2: 27 mmol/L (ref 20–32)
Calcium: 9.1 mg/dL (ref 8.6–10.2)
Creat: 0.87 mg/dL (ref 0.50–1.10)
GFR, EST AFRICAN AMERICAN: 106 mL/min/{1.73_m2} (ref >=60)
GFR, Est Non African American: 91 mL/min/{1.73_m2} (ref >=60)
Globulin: 3.4 g/dL (calc) (ref 1.9–3.7)
Glucose, Bld: 77 mg/dL (ref 65–99)
POTASSIUM: 4 mmol/L (ref 3.5–5.3)
SODIUM: 138 mmol/L (ref 135–146)
TOTAL PROTEIN: 7.5 g/dL (ref 6.1–8.1)
Total Bilirubin: 0.4 mg/dL (ref 0.2–1.2)

## 2017-10-13 LAB — CBC WITH DIFFERENTIAL/PLATELET
BASOS ABS: 21 {cells}/uL (ref 0–200)
BASOS PCT: 0.5 %
EOS ABS: 231 {cells}/uL (ref 15–500)
Eosinophils Relative: 5.5 %
HCT: 40.2 % (ref 35.0–45.0)
HEMOGLOBIN: 13.8 g/dL (ref 11.7–15.5)
Lymphs Abs: 1802 cells/uL (ref 850–3900)
MCH: 31.1 pg (ref 27.0–33.0)
MCHC: 34.3 g/dL (ref 32.0–36.0)
MCV: 90.5 fL (ref 80.0–100.0)
MPV: 13.3 fL — AB (ref 7.5–12.5)
Monocytes Relative: 8.4 %
Neutro Abs: 1793 cells/uL (ref 1500–7800)
Neutrophils Relative %: 42.7 %
PLATELETS: 153 10*3/uL (ref 140–400)
RBC: 4.44 10*6/uL (ref 3.80–5.10)
RDW: 12 % (ref 11.0–15.0)
TOTAL LYMPHOCYTE: 42.9 %
WBC: 4.2 10*3/uL (ref 3.8–10.8)
WBCMIX: 353 {cells}/uL (ref 200–950)

## 2017-10-13 LAB — T4, FREE: Free T4: 1.1 ng/dL (ref 0.8–1.8)

## 2017-10-13 LAB — THYROID PEROXIDASE ANTIBODY: THYROID PEROXIDASE ANTIBODY: 1 [IU]/mL (ref <9)

## 2017-10-13 LAB — THYROTROPIN RECEPTOR AUTOABS

## 2017-10-13 LAB — T3, FREE: T3 FREE: 2.7 pg/mL (ref 2.3–4.2)

## 2017-10-13 LAB — THYROGLOBULIN ANTIBODIES (REFL): Thyroglobulin Ab: <1 IU/mL (ref <=1)

## 2017-10-13 LAB — TSH: TSH: 1.04 mIU/L

## 2017-10-13 NOTE — Progress Notes (Signed)
Call pt: antibodies normal.

## 2017-10-13 NOTE — Progress Notes (Signed)
Normal thyroid. The thickness of the neck has to be muscle and soft tissue that is thicker than normal. Great news.

## 2018-08-29 ENCOUNTER — Encounter: Payer: Self-pay | Admitting: Physician Assistant

## 2018-08-29 ENCOUNTER — Ambulatory Visit: Payer: 59 | Admitting: Physician Assistant

## 2018-08-29 VITALS — BP 116/80 | HR 72 | Temp 97.6°F | Wt 200.4 lb

## 2018-08-29 DIAGNOSIS — R030 Elevated blood-pressure reading, without diagnosis of hypertension: Secondary | ICD-10-CM

## 2018-08-29 DIAGNOSIS — G4489 Other headache syndrome: Secondary | ICD-10-CM

## 2018-08-29 DIAGNOSIS — F5101 Primary insomnia: Secondary | ICD-10-CM | POA: Diagnosis not present

## 2018-08-29 DIAGNOSIS — F439 Reaction to severe stress, unspecified: Secondary | ICD-10-CM

## 2018-08-29 NOTE — Patient Instructions (Addendum)
unisom 25-50mg .   Stress relief formula at [place].  Insomnia Insomnia is a sleep disorder that makes it difficult to fall asleep or stay asleep. Insomnia can cause fatigue, low energy, difficulty concentrating, mood swings, and poor performance at work or school. There are three different ways to classify insomnia:  Difficulty falling asleep.  Difficulty staying asleep.  Waking up too early in the morning. Any type of insomnia can be long-term (chronic) or short-term (acute). Both are common. Short-term insomnia usually lasts for three months or less. Chronic insomnia occurs at least three times a week for longer than three months. What are the causes? Insomnia may be caused by another condition, situation, or substance, such as:  Anxiety.  Certain medicines.  Gastroesophageal reflux disease (GERD) or other gastrointestinal conditions.  Asthma or other breathing conditions.  Restless legs syndrome, sleep apnea, or other sleep disorders.  Chronic pain.  Menopause.  Stroke.  Abuse of alcohol, tobacco, or illegal drugs.  Mental health conditions, such as depression.  Caffeine.  Neurological disorders, such as Alzheimer's disease.  An overactive thyroid (hyperthyroidism). Sometimes, the cause of insomnia may not be known. What increases the risk? Risk factors for insomnia include:  Gender. Women are affected more often than men.  Age. Insomnia is more common as you get older.  Stress.  Lack of exercise.  Irregular work schedule or working night shifts.  Traveling between different time zones.  Certain medical and mental health conditions. What are the signs or symptoms? If you have insomnia, the main symptom is having trouble falling asleep or having trouble staying asleep. This may lead to other symptoms, such as:  Feeling fatigued or having low energy.  Feeling nervous about going to sleep.  Not feeling rested in the morning.  Having trouble  concentrating.  Feeling irritable, anxious, or depressed. How is this diagnosed? This condition may be diagnosed based on:  Your symptoms and medical history. Your health care provider may ask about: ? Your sleep habits. ? Any medical conditions you have. ? Your mental health.  A physical exam. How is this treated? Treatment for insomnia depends on the cause. Treatment may focus on treating an underlying condition that is causing insomnia. Treatment may also include:  Medicines to help you sleep.  Counseling or therapy.  Lifestyle adjustments to help you sleep better. Follow these instructions at home: Eating and drinking   Limit or avoid alcohol, caffeinated beverages, and cigarettes, especially close to bedtime. These can disrupt your sleep.  Do not eat a large meal or eat spicy foods right before bedtime. This can lead to digestive discomfort that can make it hard for you to sleep. Sleep habits   Keep a sleep diary to help you and your health care provider figure out what could be causing your insomnia. Write down: ? When you sleep. ? When you wake up during the night. ? How well you sleep. ? How rested you feel the next day. ? Any side effects of medicines you are taking. ? What you eat and drink.  Make your bedroom a dark, comfortable place where it is easy to fall asleep. ? Put up shades or blackout curtains to block light from outside. ? Use a white noise machine to block noise. ? Keep the temperature cool.  Limit screen use before bedtime. This includes: ? Watching TV. ? Using your smartphone, tablet, or computer.  Stick to a routine that includes going to bed and waking up at the same times every day  and night. This can help you fall asleep faster. Consider making a quiet activity, such as reading, part of your nighttime routine.  Try to avoid taking naps during the day so that you sleep better at night.  Get out of bed if you are still awake after 15  minutes of trying to sleep. Keep the lights down, but try reading or doing a quiet activity. When you feel sleepy, go back to bed. General instructions  Take over-the-counter and prescription medicines only as told by your health care provider.  Exercise regularly, as told by your health care provider. Avoid exercise starting several hours before bedtime.  Use relaxation techniques to manage stress. Ask your health care provider to suggest some techniques that may work well for you. These may include: ? Breathing exercises. ? Routines to release muscle tension. ? Visualizing peaceful scenes.  Make sure that you drive carefully. Avoid driving if you feel very sleepy.  Keep all follow-up visits as told by your health care provider. This is important. Contact a health care provider if:  You are tired throughout the day.  You have trouble in your daily routine due to sleepiness.  You continue to have sleep problems, or your sleep problems get worse. Get help right away if:  You have serious thoughts about hurting yourself or someone else. If you ever feel like you may hurt yourself or others, or have thoughts about taking your own life, get help right away. You can go to your nearest emergency department or call:  Your local emergency services (911 in the [place]  A suicide crisis helpline, such as the [facility] at 310-235-1124. This is open 24 hours a day. Summary  Insomnia is a sleep disorder that makes it difficult to fall asleep or stay asleep.  Insomnia can be long-term (chronic) or short-term (acute).  Treatment for insomnia depends on the cause. Treatment may focus on treating an underlying condition that is causing insomnia.  Keep a sleep diary to help you and your health care provider figure out what could be causing your insomnia. This information is not intended to replace advice given to you by your health care provider. Make sure you discuss  any questions you have with your health care provider. Document Released: 06/12/2000 Document Revised: 03/25/2017 Document Reviewed: 03/25/2017 Elsevier Interactive Patient Education  2019 [employer].

## 2018-08-29 NOTE — Progress Notes (Signed)
   Subjective:    Patient ID: Kendra Boone, female    DOB: 1991-05-25, 28 y.o.   MRN: [id number]  HPI  Patient is a 28 year old female who presents to the clinic with some concerns.  Her biggest concern today is her blood pressure.  She has been checking her blood pressure at home and getting readings in the upper 120s and upper 80s to 90s.  She denies any chest pains or palpitations.  She does have weird headaches on the left back of her head that is intermittent about 3-4 times a week. At times she feels a "pulsation". Aleve helps.  She does report some dizziness associated.  Pt having trouble getting to sleep. No snoring. Usually feels rested once wakes up. Melatonin not working as well anymore.   She does feel stressed. She worries a lot. She has not been on medication for this.   .. Active Ambulatory Problems    Diagnosis Date Noted  . Vitamin D deficiency 08/12/2015  . Serum sodium decreased 08/12/2015  . Low iron stores 08/12/2015  . Thyromegaly 08/12/2015  . Trouble swallowing 08/12/2015  . Dry mouth 08/12/2015  . Generalized edema 08/12/2015  . Bilateral hip pain 08/12/2015  . Abnormal weight gain 08/12/2015  . Depression 08/12/2015  . Arthralgia of both lower legs 08/12/2015  . Vaginal odor 08/12/2015  . Peroneal tendinitis of left lower extremity 02/13/2017  . Palpitations 10/11/2017  . Elevated blood pressure reading 09/05/2018  . Stress 09/05/2018  . Primary insomnia 09/05/2018   Resolved Ambulatory Problems    Diagnosis Date Noted  . No Resolved Ambulatory Problems   No Additional Past Medical History      Review of Systems See HPI.     Objective:   Physical Exam Vitals signs reviewed.  Constitutional:      Appearance: Normal appearance.  HENT:     Head: Normocephalic and atraumatic.  Eyes:     Conjunctiva/sclera: Conjunctivae normal.     Pupils: Pupils are equal, round, and reactive to light.  Neck:     Musculoskeletal: Normal range of motion.   Cardiovascular:     Rate and Rhythm: Normal rate and regular rhythm.  Pulmonary:     Effort: Pulmonary effort is normal.     Breath sounds: Normal breath sounds.  Neurological:     General: No focal deficit present.     Mental Status: She is alert and oriented to person, place, and time.  Psychiatric:        Mood and Affect: Mood normal.        Behavior: Behavior normal.           Assessment & Plan:  Marland KitchenMarland KitchenDiagnoses and all orders for this visit:  Elevated blood pressure reading  Primary insomnia  Stress  Other headache syndrome   For sleep start unisom OTC. Work on good sleep routine.   Consider stress care and [place] [facility]. Encouraged exercise to help with stress.   No elevated BP in office. Continue to monitor at home. In 2-4 weeks bring in BP cuff to have checked with our blood pressure readings.   Pt is having headaches. I suspect could be due to stress/insomnia. Could be a migraine vs tension variant. Suggested massage for relaxation.   Follow up in 4 weeks.

## 2018-09-05 ENCOUNTER — Encounter: Payer: Self-pay | Admitting: Physician Assistant

## 2018-09-05 DIAGNOSIS — F439 Reaction to severe stress, unspecified: Secondary | ICD-10-CM | POA: Insufficient documentation

## 2018-09-05 DIAGNOSIS — R030 Elevated blood-pressure reading, without diagnosis of hypertension: Secondary | ICD-10-CM | POA: Insufficient documentation

## 2018-09-05 DIAGNOSIS — F5101 Primary insomnia: Secondary | ICD-10-CM | POA: Insufficient documentation

## 2018-09-05 IMAGING — US US THYROID
1 series · 14 of 17 positions shown · non-contrast
Comparison: 08/15/2015

CLINICAL DATA: Palpable abnormality.  Thyromegaly.  Palpitations.

EXAM:
THYROID ULTRASOUND
TECHNIQUE: Ultrasound examination of the thyroid gland and adjacent soft
tissues was performed.

[Series 1: us thyroid · 0.04mm/px · 14 of 17 slices shown]
[im 1/17]
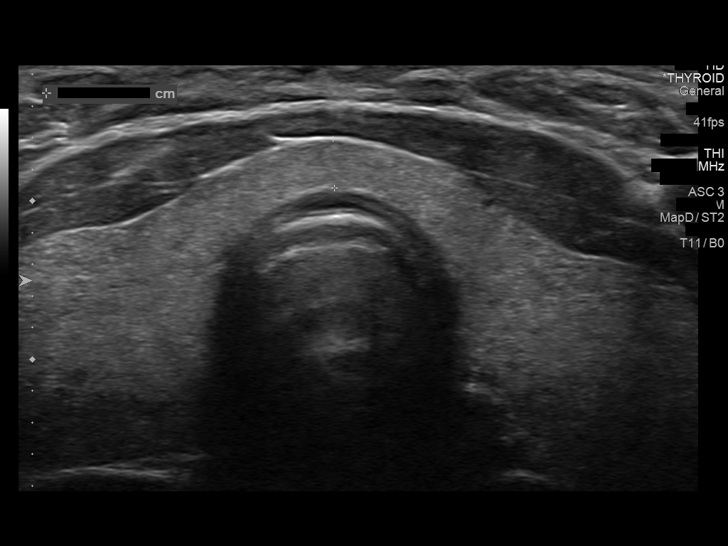
[im 2/17]
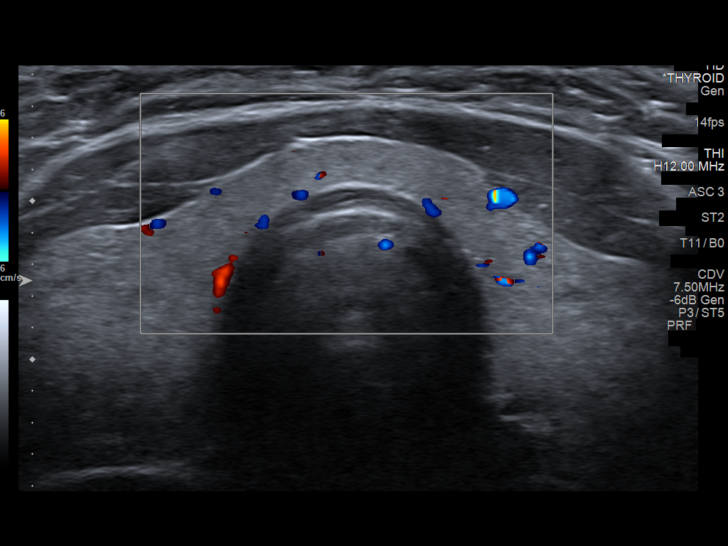
[im 4/17]
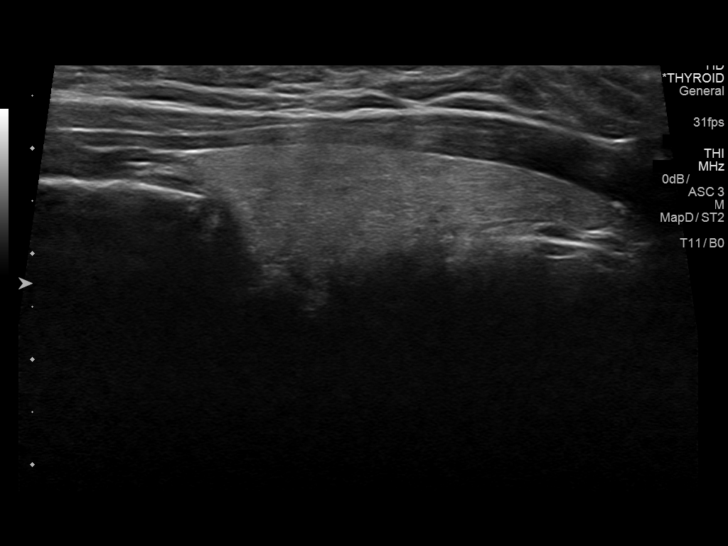
[im 5/17]
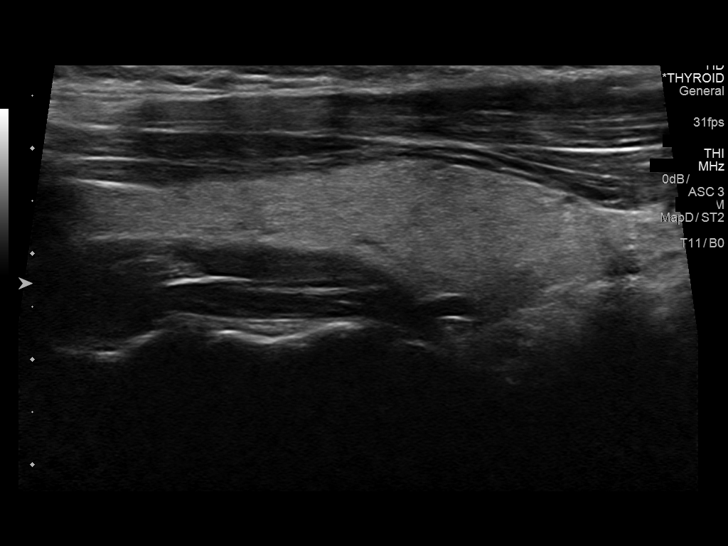
[im 6/17]
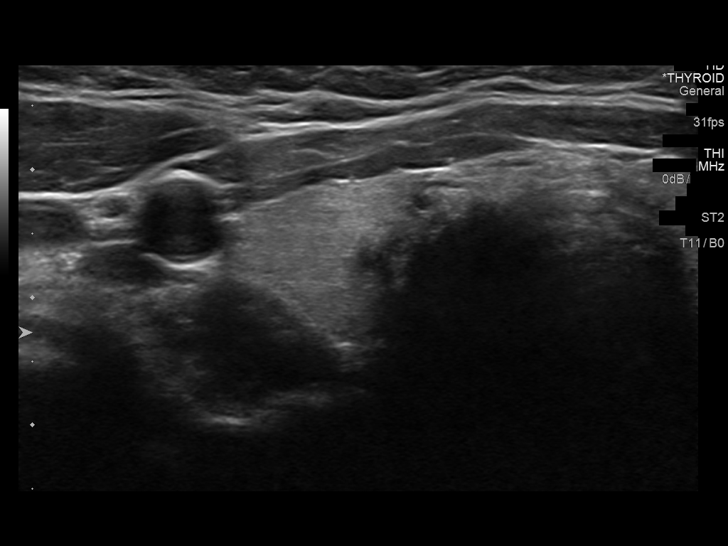
[im 7/17]
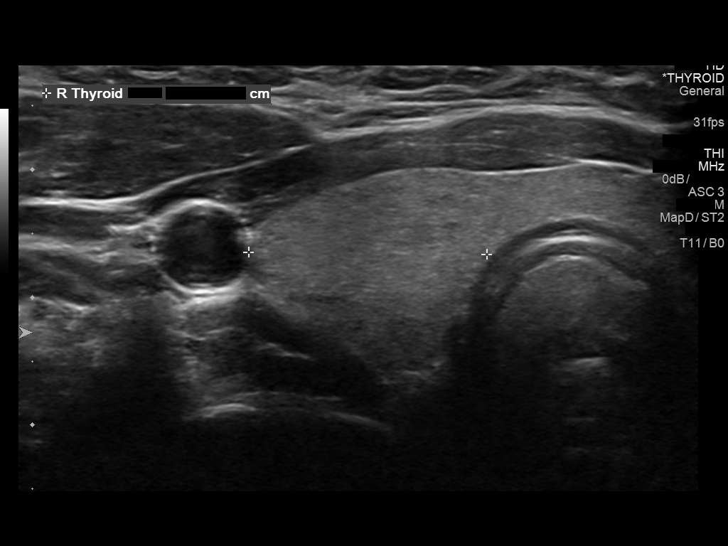
[im 8/17]
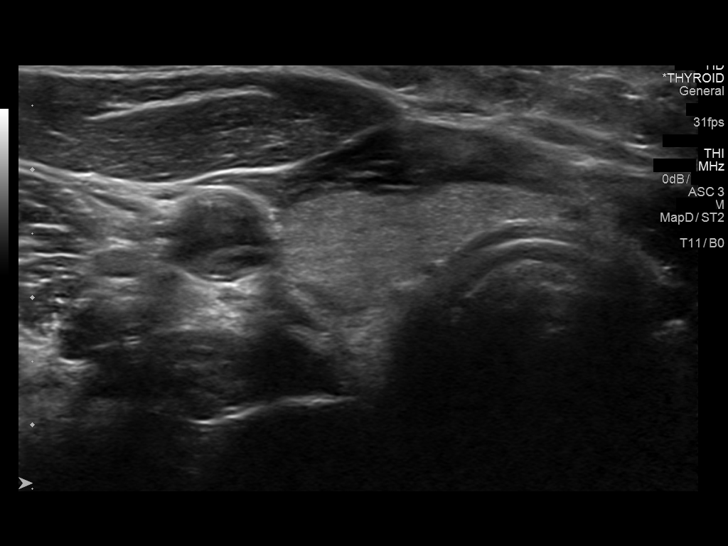
[im 10/17]
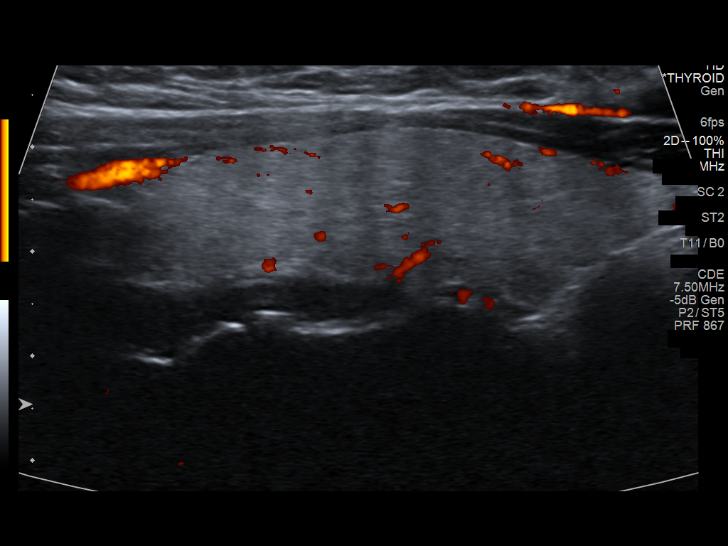
[im 11/17]
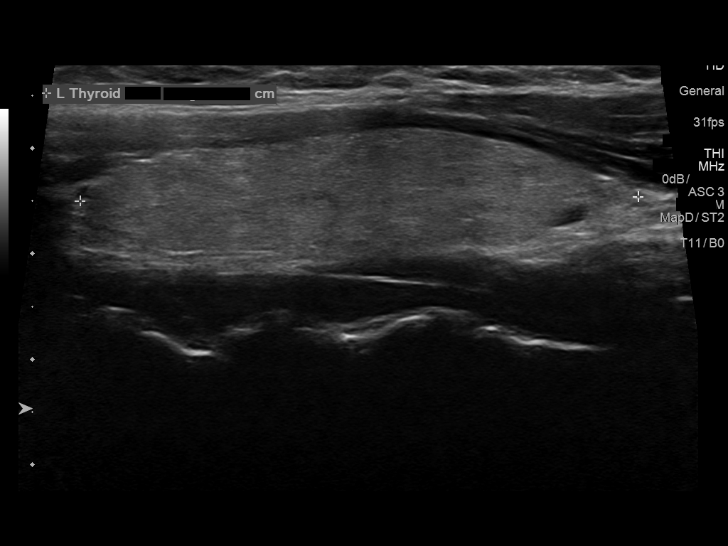
[im 12/17]
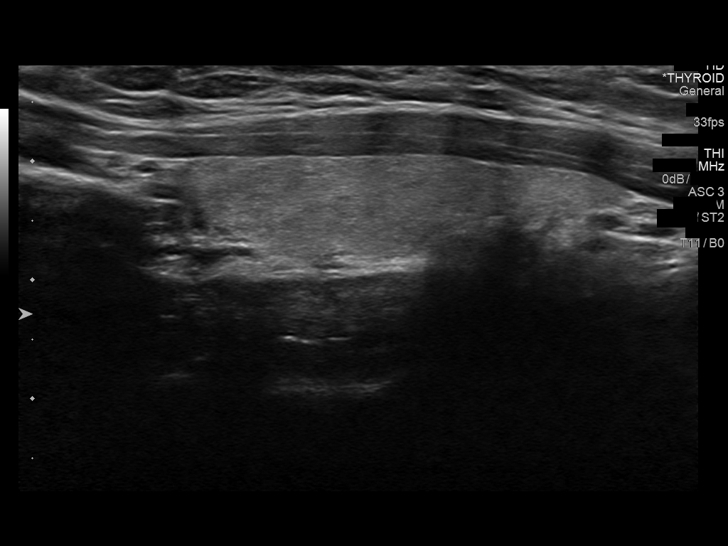
[im 13/17]
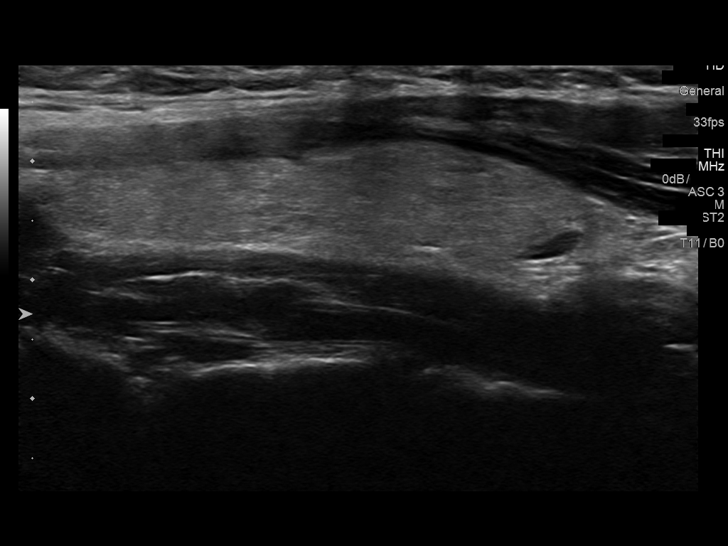
[im 14/17]
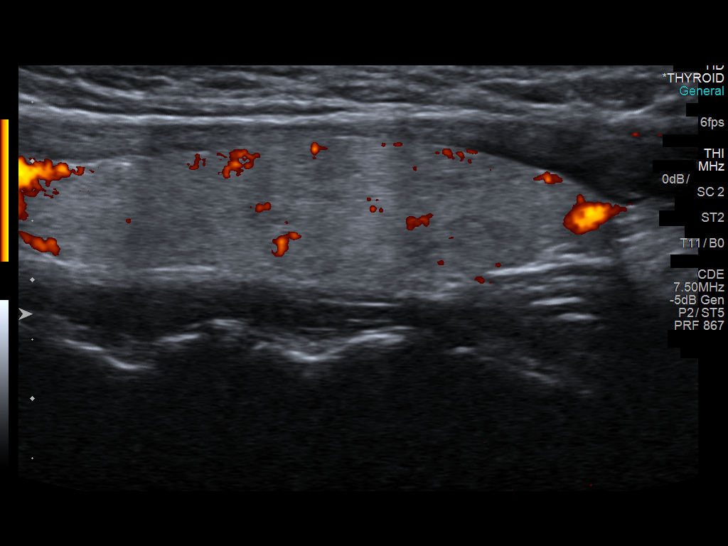
[im 16/17]
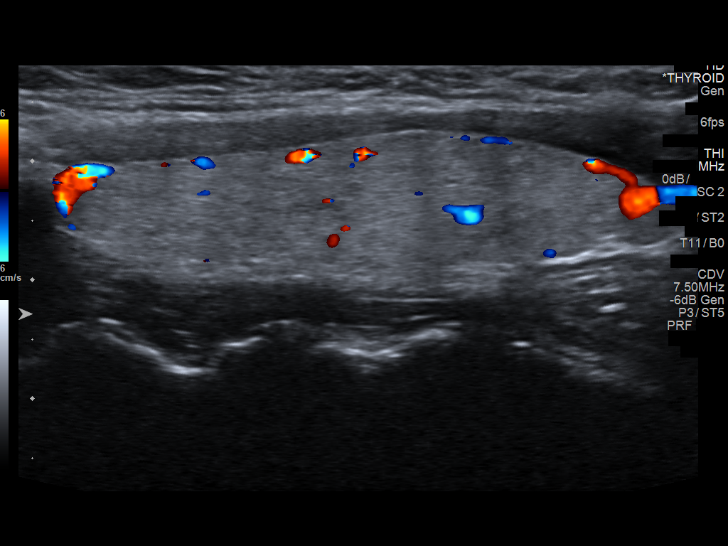
[im 17/17]
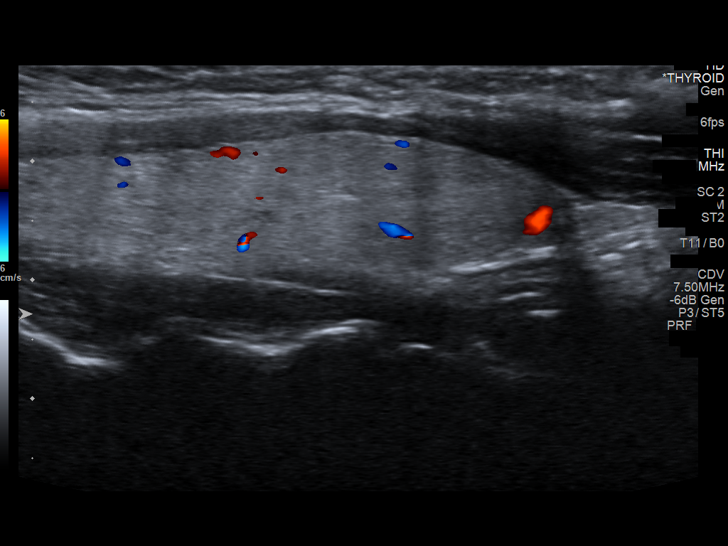

[14 of 17 positions shown; findings below may reference images not displayed]

FINDINGS: Parenchymal Echotexture: Normal - no definitive evidence of
glandular hyperemia.

Isthmus: Normal in size measures 0.3 cm in diameter, unchanged

Right lobe: Normal in size measuring 5.7 x 1.9 x 1.5 cm, unchanged,
previously, 5.7 x 2.0 x 1.7 cm

Left lobe: Normal in size measuring 5.3 x 1.7 x 1.2 cm, unchanged,
previously, 5.6 x 1.9 x 1.5 cm

_________________________________________________________

Estimated total number of nodules >/= 1 cm: 0

Number of spongiform nodules >/=  2 cm not described below (TR1): 0

Number of mixed cystic and solid nodules >/= 1.5 cm not described
below (TR2): 0

_________________________________________________________

No discrete nodules are seen within the thyroid gland.
IMPRESSION: Normal thyroid ultrasound. Specifically, no evidence of thyromegaly
or discrete thyroid nodule or mass.

The above is in keeping with the ACR TI-RADS recommendations - [HOSPITAL] 5366;[DATE].

## 2018-10-11 ENCOUNTER — Ambulatory Visit: Payer: 59 | Admitting: Physician Assistant

## 2019-04-19 ENCOUNTER — Encounter: Payer: Self-pay | Admitting: Physician Assistant

## 2019-04-19 ENCOUNTER — Other Ambulatory Visit: Payer: Self-pay

## 2019-04-19 ENCOUNTER — Ambulatory Visit (INDEPENDENT_AMBULATORY_CARE_PROVIDER_SITE_OTHER): Payer: BC Managed Care – PPO | Admitting: Physician Assistant

## 2019-04-19 VITALS — BP 117/80 | HR 87 | Ht 67.0 in | Wt 209.0 lb

## 2019-04-19 DIAGNOSIS — Z0000 Encounter for general adult medical examination without abnormal findings: Secondary | ICD-10-CM | POA: Diagnosis not present

## 2019-04-19 DIAGNOSIS — Z202 Contact with and (suspected) exposure to infections with a predominantly sexual mode of transmission: Secondary | ICD-10-CM | POA: Diagnosis not present

## 2019-04-19 DIAGNOSIS — N920 Excessive and frequent menstruation with regular cycle: Secondary | ICD-10-CM | POA: Diagnosis not present

## 2019-04-19 DIAGNOSIS — Z1331 Encounter for screening for depression: Secondary | ICD-10-CM

## 2019-04-19 DIAGNOSIS — Z113 Encounter for screening for infections with a predominantly sexual mode of transmission: Secondary | ICD-10-CM | POA: Diagnosis not present

## 2019-04-19 DIAGNOSIS — Z6832 Body mass index (BMI) 32.0-32.9, adult: Secondary | ICD-10-CM

## 2019-04-19 DIAGNOSIS — Z23 Encounter for immunization: Secondary | ICD-10-CM

## 2019-04-19 DIAGNOSIS — Z01419 Encounter for gynecological examination (general) (routine) without abnormal findings: Secondary | ICD-10-CM | POA: Diagnosis not present

## 2019-04-19 DIAGNOSIS — Z131 Encounter for screening for diabetes mellitus: Secondary | ICD-10-CM | POA: Diagnosis not present

## 2019-04-19 DIAGNOSIS — Z13220 Encounter for screening for lipoid disorders: Secondary | ICD-10-CM | POA: Diagnosis not present

## 2019-04-19 DIAGNOSIS — E6609 Other obesity due to excess calories: Secondary | ICD-10-CM

## 2019-04-19 DIAGNOSIS — N946 Dysmenorrhea, unspecified: Secondary | ICD-10-CM | POA: Diagnosis not present

## 2019-04-19 NOTE — Patient Instructions (Signed)
Bupropion; Naltrexone extended-release tablets What is this medicine? BUPROPION; NALTREXONE (byoo PROE pee on; nal TREX one) is a combination product used to promote and maintain weight loss in obese adults or overweight adults who also have weight related medical problems. This medicine should be used with a reduced calorie diet and increased physical activity. This medicine may be used for other purposes; ask your health care provider or pharmacist if you have questions. COMMON BRAND NAME(S): Contrave What should I tell my health care provider before I take this medicine? They need to know if you have any of these conditions:  an eating disorder, such as anorexia or bulimia  bipolar disorder  diabetes  depression  drug abuse or addiction  glaucoma  head injury  heart disease  high blood pressure  history of a tumor or infection of your brain or spine  history of stroke  history of irregular heartbeat  if you often drink alcohol  kidney disease  liver disease  schizophrenia  seizures  suicidal thoughts, plans, or attempt; a previous suicide attempt by you or a family member  an unusual or allergic reaction to bupropion, naltrexone, other medicines, foods, dyes, or preservatives  breast-feeding  pregnant or trying to become pregnant How should I use this medicine? Take this medicine by mouth with a glass of water. Follow the directions on the prescription label. Take this medicine in the morning and in the evenings as directed by your healthcare professional. Dennis Bast can take it with or without food. Do not take with high-fat meals as this may increase your risk of seizures. Do not crush, chew, or cut these tablets. Do not take your medicine more often than directed. Do not stop taking this medicine suddenly except upon the advice of your doctor. A special MedGuide will be given to you by the pharmacist with each prescription and refill. Be sure to read this information  carefully each time. Talk to your pediatrician regarding the use of this medicine in children. Special care may be needed. Overdosage: If you think you have taken too much of this medicine contact a poison control center or emergency room at once. NOTE: This medicine is only for you. Do not share this medicine with others. What if I miss a dose? If you miss a dose, skip the missed dose and take your next tablet at the regular time. Do not take double or extra doses. What may interact with this medicine? Do not take this medicine with any of the following medications:  any prescription or street opioid drug like codeine, heroin, methadone  linezolid  MAOIs like Carbex, Eldepryl, Marplan, Nardil, and Parnate  methylene blue (injected into a vein)  other medicines that contain bupropion like Zyban or Wellbutrin This medicine may also interact with the following medications:  alcohol  certain medicines for anxiety or sleep  certain medicines for blood pressure like metoprolol, propranolol  certain medicines for depression or psychotic disturbances  certain medicines for HIV or AIDS like efavirenz, lopinavir, nelfinavir, ritonavir  certain medicines for irregular heart beat like propafenone, flecainide  certain medicines for Parkinson's disease like amantadine, levodopa  certain medicines for seizures like carbamazepine, phenytoin, phenobarbital  cimetidine  clopidogrel  cyclophosphamide  digoxin  disulfiram  furazolidone  isoniazid  nicotine  orphenadrine  procarbazine  steroid medicines like prednisone or cortisone  stimulant medicines for attention disorders, weight loss, or to stay awake  tamoxifen  theophylline  thioridazine  thiotepa  ticlopidine  tramadol  warfarin This list  may not describe all possible interactions. Give your health care provider a list of all the medicines, herbs, non-prescription drugs, or dietary supplements you use.  Also tell them if you smoke, drink alcohol, or use illegal drugs. Some items may interact with your medicine. What should I watch for while using this medicine? This medicine is intended to be used in addition to a healthy diet and appropriate exercise. The best results are achieved this way. Do not increase or in any way change your dose without consulting your doctor or healthcare provider. Do not take this medicine with other prescription or over-the-counter weight loss products without consulting your doctor or healthcare provider. Your doctor should tell you to stop taking this medicine if you do not lose a certain amount of weight within the first 12 weeks of treatment. Visit your doctor or healthcare provider for regular checkups. Your doctor may order blood tests or other tests to see how you are doing. This medicine may cause serious skin reactions. They can happen weeks to months after starting the medicine. Contact your healthcare provider right away if you notice fevers or flu-like symptoms with a rash. The rash may be red or purple and then turn into blisters or peeling of the skin. Or, you might notice a red rash with swelling of the face, lips or lymph nodes in your neck or under your arms. This medicine may affect blood sugar levels. If you have diabetes, check with your doctor or health care professional before you change your diet or the dose of your diabetic medicine. Patients and their families should watch out for new or worsening depression or thoughts of suicide. Also watch out for sudden changes in feelings such as feeling anxious, agitated, panicky, irritable, hostile, aggressive, impulsive, severely restless, overly excited and hyperactive, or not being able to sleep. If this happens, especially at the beginning of treatment or after a change in dose, call your healthcare provider. Avoid alcoholic drinks while taking this medicine. Drinking large amounts of alcoholic beverages, using  sleeping or anxiety medicines, or quickly stopping the use of these agents while taking this medicine may increase your risk for a seizure. What side effects may I notice from receiving this medicine? Side effects that you should report to your doctor or health care professional as soon as possible:  allergic reactions like skin rash, itching or hives, swelling of the face, lips, or tongue  breathing problems  changes in vision  confusion  elevated mood, decreased need for sleep, racing thoughts, impulsive behavior  fast or irregular heartbeat  hallucinations, loss of contact with reality  increased blood pressure  rash, fever, and swollen lymph nodes  redness, blistering, peeling, or loosening of the skin, including inside the mouth  seizures  signs and symptoms of liver injury like dark yellow or brown urine; general ill feeling or flu-like symptoms; light-colored stools; loss of appetite; nausea; right upper belly pain; unusually weak or tired; yellowing of the eyes or skin  suicidal thoughts or other mood changes  vomiting Side effects that usually do not require medical attention (report to your doctor or health care professional if they continue or are bothersome):  constipation  headache  loss of appetite  indigestion, stomach upset  tremors This list may not describe all possible side effects. Call your doctor for medical advice about side effects. You may report side effects to FDA at 1-800-FDA-1088. Where should I keep my medicine? Keep out of the reach of children. Store at  room temperature between 15 and 30 degrees C (59 and 86 degrees F). Throw away any unused medicine after the expiration date. NOTE: This sheet is a summary. It may not cover all possible information. If you have questions about this medicine, talk to your doctor, pharmacist, or health care provider.  2020 [employer]/Gold Standard (2018-09-08 14:09:01) Phentermine; Topiramate  extended-release capsules What is this medicine? Phentermine; topiramate (FEN ter meen; toe PYRE a mate) is a combination of two medicines and is used to help people lose weight and maintain weight loss. It is used with a reduced-calorie diet and exercise. This medicine is only available through certified pharmacies enrolled in a special program. Your healthcare professional will tell you where you can get your medicine. If you have additional questions, you can visit the manufacture's website at [email or web address] or contact them by phone at [phone]. This medicine may be used for other purposes; ask your health care provider or pharmacist if you have questions. COMMON BRAND NAME(S): Qsymia What should I tell my health care provider before I take this medicine? They need to know if you have any of these conditions:  agitation  diarrhea  depression or other mental illness  diabetes  glaucoma  heart disease  high or low blood pressure  history of anorexia or other eating disorder  history of substance abuse  kidney stones or kidney disease  liver disease  lung disease like asthma, obstructive pulmonary disease, emphysema  metabolic acidosis  on a ketogenic diet  scheduled for surgery or a procedure  suicidal thoughts, plans, or attempt; a previous suicide attempt by you or a family member  taken an MAOI like Carbex, Eldepryl, Marplan, Nardil, or Parnate in last 14 days  thyroid disease  an unusual or allergic reaction to phentermine, topiramate, other medicines, foods, dyes, or preservatives  pregnant or trying to get pregnant  breast-feeding How should I use this medicine? Take this medicine by mouth with a glass of water. Follow the directions on the prescription label. Do not crush or chew. This medicine is usually taken with or without food once per day in the morning. Avoid taking this medicine in the evening. It may interfere with sleep. Take your doses  at regular intervals. Do not take your medicine more often than directed. A special MedGuide will be given to you by the pharmacist with each prescription and refill. Be sure to read this information carefully each time. Talk to your pediatrician regarding the use of this medicine in children. Special care may be needed. Overdosage: If you think you have taken too much of this medicine contact a poison control center or emergency room at once. NOTE: This medicine is only for you. Do not share this medicine with others. What if I miss a dose? If you miss a dose, take it as soon as you can. If it is almost time for your next dose, take only that dose. Do not take double or extra doses. What may interact with this medicine? Do not take this medicine with any of the following medications:  MAOIs like Carbex, Eldepryl, Marplan, Nardil, and Parnate This medicine may also interact with the following medications:  acetazolamide  amitriptyline  antihistamines for allergy, cough and cold  atropine  birth control pills  carbamazepine  certain medicines for bladder problems like oxybutynin, tolterodine  certain medicines for depression, anxiety, or psychotic disturbances  certain medicines for Parkinson's disease like benztropine, trihexyphenidyl  certain medicines for stomach problems like dicyclomine, hyoscyamine  certain  medicines for travel sickness like scopolamine  dichlorphenamide  digoxin  diltiazem  diuretics  hydrochlorothiazide  ipratropium  lithium  medicines for diabetes  medicines for pain, sleep, or muscle relaxation  methazolamide  phenytoin  pioglitazone  stimulant medicines for attention disorders, weight loss, or to stay awake  valproic acid  zonisamide This list may not describe all possible interactions. Give your health care provider a list of all the medicines, herbs, non-prescription drugs, or dietary supplements you use. Also tell them if  you smoke, drink alcohol, or use illegal drugs. Some items may interact with your medicine. What should I watch for while using this medicine? Visit your doctor or health care professional for regular checks on your progress. This medicine is intended to be used in addition to a healthy diet and appropriate exercise. The best results are achieved this way. Do not increase or in any way change your dose without consulting your doctor or health care professional. Do not take this medicine within 6 hours of bedtime. It can keep you from getting to sleep. Avoid drinks that contain caffeine and try to stick to a regular bedtime every night. Do not stop taking this medicine suddenly. This increases the risk of seizures. This medicine can decrease sweating and increase your body temperature. Watch for signs of deceased sweating or fever. Avoid extreme heat, hot baths, and saunas. Be careful about exercising, especially in hot weather. Contact your health care provider right away if you notice a fever or decrease in sweating. You should drink plenty of fluids while taking this medicine. If you have had kidney stones in the past, this will help to reduce your chances of forming kidney stones. If you have stomach pain, with nausea or vomiting and yellowing of your eyes or skin, call your doctor immediately. You may get drowsy or dizzy. Do not drive, use machinery, or do anything that needs mental alertness until you know how this medicine affects you. Do not stand or sit up quickly, especially if you are an older patient. This reduces the risk of dizzy or fainting spells. Alcohol may increase dizziness and drowsiness. Avoid alcoholic drinks. This medicine may affect blood sugar levels. If you have diabetes, check with your doctor or health care professional before you change your diet or the dose of your diabetic medicine. Patients and their families should watch out for worsening depression or thoughts of suicide.  Also watch out for sudden changes in feelings such as feeling anxious, agitated, panicky, irritable, hostile, aggressive, impulsive, severely restless, overly excited and hyperactive, or not being able to sleep. If this happens, especially at the beginning of treatment or after a change in dose, call your health care professional. If you notice blurred vision, eye pain, or other eye problems, seek medical attention at once for an eye exam. This medicine may increase the chance of developing metabolic acidosis. If left untreated, this can cause kidney stones, bone disease, or slowed growth in children. Symptoms include breathing fast, fatigue, loss of appetite, irregular heartbeat, or loss of consciousness. Call your doctor immediately if you experience any of these side effects. Also, tell your doctor about any surgery you plan on having while taking this medicine since this may increase your risk for metabolic acidosis. Women who become pregnant while using this medicine should contact their physician immediately. You should also contact The [employer] Pregnancy Surveillance Program which is a program that monitors pregnancies that occur during treatment. Contact the program by calling [phone]. What side  effects may I notice from receiving this medicine? Side effects that you should report to your doctor or health care professional as soon as possible:  allergic reactions like skin rash, itching or hives, swelling of the face, lips, or tongue  blood in the urine  changes in vision  chest pain or chest tightness  confusion  depressed mood  difficulty breathing  dizziness  fast or irregular heartbeat  feeling anxious  irritable  loss of appetite  low blood pressure  pain in the lower back or side  pain, tingling, numbness in the hands or feet  pain when urinating  palpitations  redness, blistering, peeling or loosening of the skin, including inside the mouth  shortness of  breath  suicidal thoughts or other mood changes  trouble passing urine or change in the amount of urine  trouble walking, dizziness, loss of balance or coordination  unusually weak or tired  vomiting Side effects that usually do not require medical attention (report to your doctor or health care professional if they continue or are bothersome):  change in sex drive or performance  changes in vision  constipation  diarrhea  dry mouth  headache  nausea  tremors  trouble sleeping  upset stomach This list may not describe all possible side effects. Call your doctor for medical advice about side effects. You may report side effects to FDA at 1-800-FDA-1088. Where should I keep my medicine? Keep out of the reach of children. This medicine can be abused. Keep your medicine in a safe place to protect it from theft. Do not share this medicine with anyone. Selling or giving away this medicine is dangerous and against the law. This medicine may cause accidental overdose and death if taken by other adults, children, or pets. Mix any unused medicine with a substance like cat litter or coffee grounds. Then throw the medicine away in a sealed container like a sealed bag or a coffee can with a lid. Do not use the medicine after the expiration date. Store at room temperature between 15 and 25 degrees C (59 and 77 degrees F). NOTE: This sheet is a summary. It may not cover all possible information. If you have questions about this medicine, talk to your doctor, pharmacist, or health care provider.  2020 Elsevier/Gold Standard (2018-07-22 17:13:27) Liraglutide injection (Weight Management) What is this medicine? LIRAGLUTIDE (LIR a GLOO tide) is used to help people lose weight and maintain weight loss. It is used with a reduced-calorie diet and exercise. This medicine may be used for other purposes; ask your health care provider or pharmacist if you have questions. COMMON BRAND NAME(S):  Saxenda What should I tell my health care provider before I take this medicine? They need to know if you have any of these conditions:  endocrine tumors (MEN 2) or if someone in your family had these tumors  gallbladder disease  high cholesterol  history of alcohol abuse problem  history of pancreatitis  kidney disease or if you are on dialysis  liver disease  previous swelling of the tongue, face, or lips with difficulty breathing, difficulty swallowing, hoarseness, or tightening of the throat  stomach problems  suicidal thoughts, plans, or attempt; a previous suicide attempt by you or a family member  thyroid cancer or if someone in your family had thyroid cancer  an unusual or allergic reaction to liraglutide, other medicines, foods, dyes, or preservatives  pregnant or trying to get pregnant  breast-feeding How should I use this medicine? This  medicine is for injection under the skin of your upper leg, stomach area, or upper arm. You will be taught how to prepare and give this medicine. Use exactly as directed. Take your medicine at regular intervals. Do not take it more often than directed. It is important that you put your used needles and syringes in a special sharps container. Do not put them in a trash can. If you do not have a sharps container, call your pharmacist or healthcare provider to get one. A special MedGuide will be given to you by the pharmacist with each prescription and refill. Be sure to read this information carefully each time. Talk to your pediatrician regarding the use of this medicine in children. Special care may be needed. Overdosage: If you think you have taken too much of this medicine contact a poison control center or emergency room at once. NOTE: This medicine is only for you. Do not share this medicine with others. What if I miss a dose? If you miss a dose, take it as soon as you can. If it is almost time for your next dose, take only that  dose. Do not take double or extra doses. If you miss your dose for 3 days or more, call your doctor or health care professional to talk about how to restart this medicine. What may interact with this medicine?  insulin and other medicines for diabetes This list may not describe all possible interactions. Give your health care provider a list of all the medicines, herbs, non-prescription drugs, or dietary supplements you use. Also tell them if you smoke, drink alcohol, or use illegal drugs. Some items may interact with your medicine. What should I watch for while using this medicine? Visit your doctor or health care professional for regular checks on your progress. This medicine is intended to be used in addition to a healthy diet and appropriate exercise. The best results are achieved this way. Do not increase or in any way change your dose without consulting your doctor or health care professional. Drink plenty of fluids while taking this medicine. Check with your doctor or health care professional if you get an attack of severe diarrhea, nausea, and vomiting. The loss of too much body fluid can make it dangerous for you to take this medicine. This medicine may affect blood sugar levels. If you have diabetes, check with your doctor or health care professional before you change your diet or the dose of your diabetic medicine. Patients and their families should watch out for worsening depression or thoughts of suicide. Also watch out for sudden changes in feelings such as feeling anxious, agitated, panicky, irritable, hostile, aggressive, impulsive, severely restless, overly excited and hyperactive, or not being able to sleep. If this happens, especially at the beginning of treatment or after a change in dose, call your health care professional. What side effects may I notice from receiving this medicine? Side effects that you should report to your doctor or health care professional as soon as  possible:  allergic reactions like skin rash, itching or hives, swelling of the face, lips, or tongue  breathing problems  diarrhea that continues or is severe  lump or swelling on the neck  severe nausea  signs and symptoms of infection like fever or chills; cough; sore throat; pain or trouble passing urine  signs and symptoms of low blood sugar such as feeling anxious, confusion, dizziness, increased hunger, unusually weak or tired, sweating, shakiness, cold, irritable, headache, blurred vision, fast heartbeat,  loss of consciousness  signs and symptoms of kidney injury like trouble passing urine or change in the amount of urine  trouble swallowing  unusual stomach upset or pain  vomiting Side effects that usually do not require medical attention (report to your doctor or health care professional if they continue or are bothersome):  constipation  decreased appetite  diarrhea  fatigue  headache  nausea  pain, redness, or irritation at site where injected  stomach upset  stuffy or runny nose This list may not describe all possible side effects. Call your doctor for medical advice about side effects. You may report side effects to FDA at 1-800-FDA-1088. Where should I keep my medicine? Keep out of the reach of children. Store unopened pen in a refrigerator between 2 and 8 degrees C (36 and 46 degrees F). Do not freeze or use if the medicine has been frozen. Protect from light and excessive heat. After you first use the pen, it can be stored at room temperature between 15 and 30 degrees C (59 and 86 degrees F) or in a refrigerator. Throw away your used pen after 30 days or after the expiration date, whichever comes first. Do not store your pen with the needle attached. If the needle is left on, medicine may leak from the pen. NOTE: This sheet is a summary. It may not cover all possible information. If you have questions about this medicine, talk to your doctor,  pharmacist, or health care provider.  2020 Elsevier/Gold Standard (2018-07-22 17:10:35) Health Maintenance, Female Adopting a healthy lifestyle and getting preventive care are important in promoting health and wellness. Ask your health care provider about:  The right schedule for you to have regular tests and exams.  Things you can do on your own to prevent diseases and keep yourself healthy. What should I know about diet, weight, and exercise? Eat a healthy diet   Eat a diet that includes plenty of vegetables, fruits, low-fat dairy products, and lean protein.  Do not eat a lot of foods that are high in solid fats, added sugars, or sodium. Maintain a healthy weight Body mass index (BMI) is used to identify weight problems. It estimates body fat based on height and weight. Your health care provider can help determine your BMI and help you achieve or maintain a healthy weight. Get regular exercise Get regular exercise. This is one of the most important things you can do for your health. Most adults should:  Exercise for at least 150 minutes each week. The exercise should increase your heart rate and make you sweat (moderate-intensity exercise).  Do strengthening exercises at least twice a week. This is in addition to the moderate-intensity exercise.  Spend less time sitting. Even light physical activity can be beneficial. Watch cholesterol and blood lipids Have your blood tested for lipids and cholesterol at 29 years of age, then have this test every 5 years. Have your cholesterol levels checked more often if:  Your lipid or cholesterol levels are high.  You are older than 29 years of age.  You are at high risk for heart disease. What should I know about cancer screening? Depending on your health history and family history, you may need to have cancer screening at various ages. This may include screening for:  Breast cancer.  Cervical cancer.  Colorectal cancer.  Skin  cancer.  Lung cancer. What should I know about heart disease, diabetes, and high blood pressure? Blood pressure and heart disease  High blood pressure  causes heart disease and increases the risk of stroke. This is more likely to develop in people who have high blood pressure readings, are of African descent, or are overweight.  Have your blood pressure checked: ? Every 3-5 years if you are 68-75 years of age. ? Every year if you are 59 years old or older. Diabetes Have regular diabetes screenings. This checks your fasting blood sugar level. Have the screening done:  Once every three years after age 75 if you are at a normal weight and have a low risk for diabetes.  More often and at a younger age if you are overweight or have a high risk for diabetes. What should I know about preventing infection? Hepatitis B If you have a higher risk for hepatitis B, you should be screened for this virus. Talk with your health care provider to find out if you are at risk for hepatitis B infection. Hepatitis C Testing is recommended for:  Everyone born from [birth year] through 1966.  Anyone with known risk factors for hepatitis C. Sexually transmitted infections (STIs)  Get screened for STIs, including gonorrhea and chlamydia, if: ? You are sexually active and are younger than 29 years of age. ? You are older than 29 years of age and your health care provider tells you that you are at risk for this type of infection. ? Your sexual activity has changed since you were last screened, and you are at increased risk for chlamydia or gonorrhea. Ask your health care provider if you are at risk.  Ask your health care provider about whether you are at high risk for HIV. Your health care provider may recommend a prescription medicine to help prevent HIV infection. If you choose to take medicine to prevent HIV, you should first get tested for HIV. You should then be tested every 3 months for as long as you are taking  the medicine. Pregnancy  If you are about to stop having your period (premenopausal) and you may become pregnant, seek counseling before you get pregnant.  Take 400 to 800 micrograms (mcg) of folic acid every day if you become pregnant.  Ask for birth control (contraception) if you want to prevent pregnancy. Osteoporosis and menopause Osteoporosis is a disease in which the bones lose minerals and strength with aging. This can result in bone fractures. If you are 77 years old or older, or if you are at risk for osteoporosis and fractures, ask your health care provider if you should:  Be screened for bone loss.  Take a calcium or vitamin D supplement to lower your risk of fractures.  Be given hormone replacement therapy (HRT) to treat symptoms of menopause. Follow these instructions at home: Lifestyle  Do not use any products that contain nicotine or tobacco, such as cigarettes, e-cigarettes, and chewing tobacco. If you need help quitting, ask your health care provider.  Do not use street drugs.  Do not share needles.  Ask your health care provider for help if you need support or information about quitting drugs. Alcohol use  Do not drink alcohol if: ? Your health care provider tells you not to drink. ? You are pregnant, may be pregnant, or are planning to become pregnant.  If you drink alcohol: ? Limit how much you use to 0-1 drink a day. ? Limit intake if you are breastfeeding.  Be aware of how much alcohol is in your drink. In the [place] one drink equals one 12 oz bottle of beer (355 mL),  one 5 oz glass of wine (148 mL), or one 1 oz glass of hard liquor (44 mL). General instructions  Schedule regular health, dental, and eye exams.  Stay current with your vaccines.  Tell your health care provider if: ? You often feel depressed. ? You have ever been abused or do not feel safe at home. Summary  Adopting a healthy lifestyle and getting preventive care are important in  promoting health and wellness.  Follow your health care provider's instructions about healthy diet, exercising, and getting tested or screened for diseases.  Follow your health care provider's instructions on monitoring your cholesterol and blood pressure. This information is not intended to replace advice given to you by your health care provider. Make sure you discuss any questions you have with your health care provider. Document Released: 12/29/2010 Document Revised: 06/08/2018 Document Reviewed: 06/08/2018 Elsevier Patient Education  2020 [employer].

## 2019-04-19 NOTE — Progress Notes (Signed)
Subjective:     Kendra Boone is a 28 y.o. female and is here for a comprehensive physical exam. The patient reports problems - she struggles to lose weight. she does not eat fast food and exercises 2-3 times a week at least 30 minutes. .   Social History   Socioeconomic History  . Marital status: Single    Spouse name: Not on file  . Number of children: 0  . Years of education: Not on file  . Highest education level: Not on file  Occupational History  . Occupation: Teacher, English as a foreign language  Social Needs  . Financial resource strain: Not on file  . Food insecurity    Worry: Not on file    Inability: Not on file  . Transportation needs    Medical: Not on file    Non-medical: Not on file  Tobacco Use  . Smoking status: Never Smoker  . Smokeless tobacco: Never Used  Substance and Sexual Activity  . Alcohol use: Yes    Alcohol/week: 7.0 standard drinks    Types: 7 Standard drinks or equivalent per week    Comment: 1 per day  . Drug use: No  . Sexual activity: Yes    Birth control/protection: Implant  Lifestyle  . Physical activity    Days per week: Not on file    Minutes per session: Not on file  . Stress: Not on file  Relationships  . Social Herbalist on phone: Not on file    Gets together: Not on file    Attends religious service: Not on file    Active member of club or organization: Not on file    Attends meetings of clubs or organizations: Not on file    Relationship status: Not on file  . Intimate partner violence    Fear of current or ex partner: Not on file    Emotionally abused: Not on file    Physically abused: Not on file    Forced sexual activity: Not on file  Other Topics Concern  . 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 HIV Screening  04/23/2020 (Originally 05/26/2005)  . PAP-Cervical Cytology Screening  04/18/2022  . PAP SMEAR-Modifier  04/18/2022  .  TETANUS/TDAP  04/18/2029  . INFLUENZA VACCINE  Discontinued    The following portions of the patient's history were reviewed and updated as appropriate: allergies, current medications, past family history, past medical history, past social history, past surgical history and problem list.  Review of Systems Pertinent items noted in HPI and remainder of comprehensive ROS otherwise negative.   Objective:    BP 117/80   Pulse 87   Ht 5\' 7"  (1.702 m)   Wt 209 lb (94.8 kg)   SpO2 97%   BMI 32.73 kg/m  General appearance: alert, cooperative and appears stated age Head: Normocephalic, without obvious abnormality, atraumatic Eyes: conjunctivae/corneas clear. PERRL, EOM's intact. Fundi benign. Ears: normal TM's and external ear canals both ears Nose: Nares normal. Septum midline. Mucosa normal. No drainage or sinus tenderness. Throat: lips, mucosa, and tongue normal; teeth and gums normal Neck: no adenopathy, no carotid bruit, no JVD, supple, symmetrical, trachea midline and thyroid not enlarged, symmetric, no tenderness/mass/nodules Back: symmetric, no curvature. ROM normal. No CVA tenderness. Lungs: clear to auscultation bilaterally Heart: regular rate and rhythm, S1, S2 normal, no murmur, click, rub or gallop Abdomen: soft, non-tender; bowel  sounds normal; no masses,  no organomegaly Extremities: extremities normal, atraumatic, no cyanosis or edema Pulses: 2+ and symmetric Skin: Skin color, texture, turgor normal. No rashes or lesions Lymph nodes: Cervical, supraclavicular, and axillary nodes normal. Neurologic: Alert and oriented X 3, normal strength and tone. Normal symmetric reflexes. Normal coordination and gait    .Marland Kitchen Depression screen [facility] 2/9 04/19/2019 10/11/2017 02/24/2017  Decreased Interest 1 1 1   Down, Depressed, Hopeless 1 1 1   PHQ - 2 Score 2 2 2   Altered sleeping 3 2 -  Tired, decreased energy 2 3 -  Change in appetite 2 1 -  Feeling bad or failure about yourself  0 0 -   Trouble concentrating 1 0 -  Moving slowly or fidgety/restless 0 0 -  Suicidal thoughts 0 0 -  PHQ-9 Score 10 8 -  Difficult doing work/chores Somewhat difficult Somewhat difficult -    Assessment:    Healthy female exam.      Plan:    .Kendra Boone was seen today for annual exam.  Diagnoses and all orders for this visit:  Routine physical examination -     TSH -     COMPLETE METABOLIC PANEL WITH GFR -     CBC with Differential/Platelet -     Lipid Panel w/reflex Direct LDL  Need for tetanus booster -     Tdap vaccine greater than or equal to 7yo IM  Screening for diabetes mellitus -     COMPLETE METABOLIC PANEL WITH GFR  Screening for lipid disorders -     Lipid Panel w/reflex Direct LDL  Class 1 obesity due to excess calories without serious comorbidity with body mass index (BMI) of 32.0 to 32.9 in adult  Positive depression screening   PHQ-9 a little elevated. Discussed mood with patient. Pt does not want medication. Discussed exercise, meditation, natural herbs, counseling.   .. Discussed 150 minutes of exercise a week.  Encouraged vitamin D 1000 units and Calcium 1300mg  or 4 servings of dairy a day.  Pap at GYN. Fasting labs ordered.  Tdap given.  Flu shot declined.   Marland Kitchen.Discussed low carb diet with 1500 calories and 80g of protein.  Exercising at least 150 minutes a week.  [employer] could be a Microbiologist.    See After Visit Summary for Counseling Recommendations

## 2019-04-20 LAB — CBC WITH DIFFERENTIAL/PLATELET
Absolute Monocytes: 439 cells/uL (ref 200–950)
Basophils Absolute: 12 cells/uL (ref 0–200)
Basophils Relative: 0.2 %
Eosinophils Absolute: 146 cells/uL (ref 15–500)
Eosinophils Relative: 2.4 %
HCT: 40.2 % (ref 35.0–45.0)
Hemoglobin: 13.4 g/dL (ref 11.7–15.5)
Lymphs Abs: 1989 cells/uL (ref 850–3900)
MCH: 30.7 pg (ref 27.0–33.0)
MCHC: 33.3 g/dL (ref 32.0–36.0)
MCV: 92 fL (ref 80.0–100.0)
MPV: 13.4 fL — ABNORMAL HIGH (ref 7.5–12.5)
Monocytes Relative: 7.2 %
Neutro Abs: 3514 cells/uL (ref 1500–7800)
Neutrophils Relative %: 57.6 %
Platelets: 215 10*3/uL (ref 140–400)
RBC: 4.37 10*6/uL (ref 3.80–5.10)
RDW: 12.9 % (ref 11.0–15.0)
Total Lymphocyte: 32.6 %
WBC: 6.1 10*3/uL (ref 3.8–10.8)

## 2019-04-20 LAB — COMPLETE METABOLIC PANEL WITH GFR
AG Ratio: 1.1 (calc) (ref 1.0–2.5)
ALT: 14 U/L (ref 6–29)
AST: 13 U/L (ref 10–30)
Albumin: 3.9 g/dL (ref 3.6–5.1)
Alkaline phosphatase (APISO): 56 U/L (ref 31–125)
BUN: 11 mg/dL (ref 7–25)
CO2: 26 mmol/L (ref 20–32)
Calcium: 9.4 mg/dL (ref 8.6–10.2)
Chloride: 104 mmol/L (ref 98–110)
Creat: 0.97 mg/dL (ref 0.50–1.10)
GFR, Est African American: 92 mL/min/{1.73_m2} (ref >=60)
GFR, Est Non African American: 79 mL/min/{1.73_m2} (ref >=60)
Globulin: 3.5 g/dL (calc) (ref 1.9–3.7)
Glucose, Bld: 82 mg/dL (ref 65–99)
Potassium: 4.1 mmol/L (ref 3.5–5.3)
Sodium: 140 mmol/L (ref 135–146)
Total Bilirubin: 0.4 mg/dL (ref 0.2–1.2)
Total Protein: 7.4 g/dL (ref 6.1–8.1)

## 2019-04-20 LAB — LIPID PANEL W/REFLEX DIRECT LDL
Cholesterol: 194 mg/dL (ref <200)
HDL: 59 mg/dL (ref >=50)
LDL Cholesterol (Calc): 121 mg/dL (calc) — ABNORMAL HIGH
Non-HDL Cholesterol (Calc): 135 mg/dL (calc) — ABNORMAL HIGH (ref <130)
Total CHOL/HDL Ratio: 3.3 (calc) (ref <5.0)
Triglycerides: 57 mg/dL (ref <150)

## 2019-04-20 LAB — TSH: TSH: 0.39 mIU/L — ABNORMAL LOW

## 2019-04-21 ENCOUNTER — Encounter: Payer: Self-pay | Admitting: Physician Assistant

## 2019-04-21 ENCOUNTER — Other Ambulatory Visit: Payer: Self-pay | Admitting: Neurology

## 2019-04-21 DIAGNOSIS — R7989 Other specified abnormal findings of blood chemistry: Secondary | ICD-10-CM

## 2019-04-21 NOTE — Progress Notes (Signed)
Call pt:  Kidney, liver, glucose look good.  No anemia.  Cholesterol is good. LDL is a little up at 121 goal under 100.  Your TSH level is low. This is a sign of too much thyroid hormone called hyperthyroidism. I don't like to make dx based on one reading. Recheck in 2 weeks.   Kendra Boone please check TSH, free t4, free t3, thyroglobulin antibodies.

## 2019-04-24 ENCOUNTER — Encounter: Payer: Self-pay | Admitting: Physician Assistant

## 2019-04-24 DIAGNOSIS — Z6832 Body mass index (BMI) 32.0-32.9, adult: Secondary | ICD-10-CM | POA: Insufficient documentation

## 2019-04-24 DIAGNOSIS — Z1331 Encounter for screening for depression: Secondary | ICD-10-CM | POA: Insufficient documentation

## 2019-04-24 DIAGNOSIS — E6609 Other obesity due to excess calories: Secondary | ICD-10-CM | POA: Insufficient documentation

## 2019-05-02 ENCOUNTER — Encounter: Payer: Self-pay | Admitting: Physician Assistant

## 2019-05-05 DIAGNOSIS — E010 Iodine-deficiency related diffuse (endemic) goiter: Secondary | ICD-10-CM | POA: Diagnosis not present

## 2019-05-05 DIAGNOSIS — R7989 Other specified abnormal findings of blood chemistry: Secondary | ICD-10-CM | POA: Diagnosis not present

## 2019-05-08 LAB — THYROGLOBULIN ANTIBODY: Thyroglobulin Ab: <1 IU/mL (ref <=1)

## 2019-05-08 LAB — TSH: TSH: 0.54 mIU/L

## 2019-05-08 LAB — T4, FREE: Free T4: 1.2 ng/dL (ref 0.8–1.8)

## 2019-05-08 LAB — T3, FREE: T3, Free: 2.9 pg/mL (ref 2.3–4.2)

## 2019-05-08 NOTE — Progress Notes (Signed)
Call pt: Good news. TSH normalized. Certainly TSH is still low normal but in the normal range. I would suggest a 3 month lab follow up.

## 2019-05-08 NOTE — Progress Notes (Signed)
I have not seen any notes from eye doctor yet. If you wanna call them can look at notes. I can then better tell you about follow up.  No antibodies to thyroid. Which is great news.

## 2020-02-19 ENCOUNTER — Telehealth: Payer: Self-pay

## 2020-02-19 NOTE — Telephone Encounter (Signed)
Pt called with concerns of a yeast infection. Requesting if provider can send in Diflucan to the pharmacy. Per pt, unable to come in for a visit because she is currently in [place]. Pt did not mention pharmacy information. Pls advise, thanks.

## 2020-02-19 NOTE — Telephone Encounter (Signed)
Nectar for diflucan 150mg  once #1 NRF to be sent to pharmacy pt reports at call back.

## 2020-02-23 NOTE — Telephone Encounter (Signed)
Left a message for a return call.

## 2020-04-20 DIAGNOSIS — Z03818 Encounter for observation for suspected exposure to other biological agents ruled out: Secondary | ICD-10-CM | POA: Diagnosis not present

## 2020-04-20 DIAGNOSIS — Z20822 Contact with and (suspected) exposure to covid-19: Secondary | ICD-10-CM | POA: Diagnosis not present

## 2020-05-28 ENCOUNTER — Ambulatory Visit (INDEPENDENT_AMBULATORY_CARE_PROVIDER_SITE_OTHER): Payer: BC Managed Care – PPO | Admitting: Physician Assistant

## 2020-05-28 ENCOUNTER — Encounter: Payer: Self-pay | Admitting: Physician Assistant

## 2020-05-28 ENCOUNTER — Other Ambulatory Visit: Payer: Self-pay

## 2020-05-28 VITALS — BP 107/77 | HR 66 | Ht 67.0 in | Wt 194.0 lb

## 2020-05-28 DIAGNOSIS — Z113 Encounter for screening for infections with a predominantly sexual mode of transmission: Secondary | ICD-10-CM

## 2020-05-28 DIAGNOSIS — Z1159 Encounter for screening for other viral diseases: Secondary | ICD-10-CM | POA: Diagnosis not present

## 2020-05-28 DIAGNOSIS — Z0000 Encounter for general adult medical examination without abnormal findings: Secondary | ICD-10-CM | POA: Diagnosis not present

## 2020-05-28 DIAGNOSIS — E010 Iodine-deficiency related diffuse (endemic) goiter: Secondary | ICD-10-CM

## 2020-05-28 DIAGNOSIS — Z13220 Encounter for screening for lipoid disorders: Secondary | ICD-10-CM | POA: Diagnosis not present

## 2020-05-28 DIAGNOSIS — R7989 Other specified abnormal findings of blood chemistry: Secondary | ICD-10-CM | POA: Diagnosis not present

## 2020-05-28 DIAGNOSIS — F419 Anxiety disorder, unspecified: Secondary | ICD-10-CM

## 2020-05-28 DIAGNOSIS — Z131 Encounter for screening for diabetes mellitus: Secondary | ICD-10-CM

## 2020-05-28 DIAGNOSIS — F32A Depression, unspecified: Secondary | ICD-10-CM

## 2020-05-28 MED ORDER — CITALOPRAM HYDROBROMIDE 10 MG PO TABS: 10.0000 mg | ORAL_TABLET | Freq: Every day | ORAL | 1 refills | Status: DC

## 2020-05-28 NOTE — Progress Notes (Signed)
Subjective:     Kendra Boone is a 29 y.o. female and is here for a comprehensive physical exam. The patient reports problems - she is struggling more with anxiety and depression. she has never been on medication. she feels like anxiety is starting to interfer with day to day life and sleep and would like to start something. no SI/HC> .  Social History   Socioeconomic History  . Marital status: Single    Spouse name: Not on file  . Number of children: 0  . Years of education: Not on file  . Highest education level: Not on file  Occupational History  . Occupation: Teacher, English as a foreign language  Tobacco Use  . Smoking status: Never Smoker  . Smokeless tobacco: Never Used  Substance and Sexual Activity  . Alcohol use: Yes    Alcohol/week: 7.0 standard drinks    Types: 7 Standard drinks or equivalent per week    Comment: 1 per day  . Drug use: No  . Sexual activity: Yes    Birth control/protection: Implant  Other Topics Concern  . 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 Difficulty of Paying Living Expenses: Not on file  Food Insecurity:   . Worried About Charity fundraiser in the Last Year: Not on file  . Ran Out of Food in the Last Year: Not on file  Transportation Needs:   . Lack of Transportation (Medical): Not on file  . Lack of Transportation (Non-Medical): Not on file  Physical Activity:   . Days of Exercise per Week: Not on file  . Minutes of Exercise per Session: Not on file  Stress:   . Feeling of Stress : Not on file  Social Connections:   . Frequency of Communication with Friends and Family: Not on file  . Frequency of Social Gatherings with Friends and Family: Not on file  . Attends Religious Services: Not on file  . Active Member of Clubs or Organizations: Not on file  . Attends Archivist Meetings: Not on file  . Marital Status: Not on file   Intimate Partner Violence:   . Fear of Current or Ex-Partner: Not on file  . Emotionally Abused: Not on file  . Physically Abused: Not on file  . Sexually Abused: Not on file   Health Maintenance  Topic Date Due  . Hepatitis C Screening  Never done  . HIV Screening  Never done  . PAP SMEAR-Modifier  04/18/2022  . TETANUS/TDAP  04/18/2029  . COVID-19 Vaccine  Completed  . INFLUENZA VACCINE  Discontinued    The following portions of the patient's history were reviewed and updated as appropriate: allergies, current medications, past family history, past medical history, past social history, past surgical history and problem list.  Review of Systems Pertinent items noted in HPI and remainder of comprehensive ROS otherwise negative.   Objective:    BP 107/77   Pulse 66   Ht 5\' 7"  (1.702 m)   Wt 194 lb (88 kg)   SpO2 100%   BMI 30.38 kg/m  General appearance: alert, cooperative, appears stated age and mildly obese Head: Normocephalic, without obvious abnormality, atraumatic Eyes: conjunctivae/corneas clear. PERRL, EOM's intact. Fundi benign. Ears: normal TM's and external ear canals both ears Nose: Nares normal. Septum midline. Mucosa normal. No drainage or sinus tenderness. Throat: lips, mucosa, and tongue normal; teeth and  gums normal Neck: no adenopathy, no carotid bruit, no JVD, supple, symmetrical, trachea midline and thyroid: enlarged Back: symmetric, no curvature. ROM normal. No CVA tenderness. Lungs: clear to auscultation bilaterally Heart: regular rate and rhythm, S1, S2 normal, no murmur, click, rub or gallop Abdomen: soft, non-tender; bowel sounds normal; no masses,  no organomegaly Extremities: extremities normal, atraumatic, no cyanosis or edema Pulses: 2+ and symmetric Skin: Skin color, texture, turgor normal. No rashes or lesions Lymph nodes: Cervical, supraclavicular, and axillary nodes normal. Neurologic: Alert and oriented X 3, normal strength and tone.  Normal symmetric reflexes. Normal coordination and gait   .Marland Kitchen Depression screen [facility] 2/9 05/28/2020 04/19/2019 10/11/2017 02/24/2017  Decreased Interest 1 1 1 1   Down, Depressed, Hopeless 1 1 1 1   PHQ - 2 Score 2 2 2 2   Altered sleeping 3 3 2  -  Tired, decreased energy 2 2 3  -  Change in appetite 1 2 1  -  Feeling bad or failure about yourself  0 0 0 -  Trouble concentrating 0 1 0 -  Moving slowly or fidgety/restless 0 0 0 -  Suicidal thoughts 0 0 0 -  PHQ-9 Score 8 10 8  -  Difficult doing work/chores Somewhat difficult Somewhat difficult Somewhat difficult -   .. GAD 7 : Generalized Anxiety Score 05/28/2020 04/19/2019 10/11/2017  Nervous, Anxious, on Edge 3 3 1   Control/stop worrying 1 2 0  Worry too much - different things 1 2 1   Trouble relaxing 3 2 1   Restless 1 0 0  Easily annoyed or irritable 1 1 1   Afraid - awful might happen 0 0 0  Total GAD 7 Score 10 10 4   Anxiety Difficulty Somewhat difficult Somewhat difficult Somewhat difficult     Assessment:    Healthy female exam.      Plan:    Marland KitchenMarland KitchenDontrice was seen today for annual exam.  Diagnoses and all orders for this visit:  Routine physical examination -     C. trachomatis/N. gonorrhoeae RNA -     CBC with Differential/Platelet -     COMPLETE METABOLIC PANEL WITH GFR -     Lipid Panel w/reflex Direct LDL -     TSH  Screen for STD (sexually transmitted disease) -     C. trachomatis/N. gonorrhoeae RNA -     HIV antibody (with reflex) -     RPR  Thyromegaly -     TSH  Decreased thyroid stimulating hormone (TSH) level -     TSH  Screening for diabetes mellitus -     COMPLETE METABOLIC PANEL WITH GFR  Screening for lipid disorders -     Lipid Panel w/reflex Direct LDL  Encounter for hepatitis C screening test for low risk patient -     Hepatitis C Antibody  Anxiety and depression -     citalopram (CELEXA) 10 MG tablet; Take 1 tablet (10 mg total) by mouth daily.  Other orders -     Discontinue:  citalopram (CELEXA) 10 MG tablet; Take 1 tablet (10 mg total) by mouth daily.   .. Discussed 150 minutes of exercise a week.  Encouraged vitamin D 1000 units and Calcium 1300mg  or 4 servings of dairy a day.  Pap UTD. STD screening ordered today.  Fasting labs ordered.  covid vaccine UTD.  Declined flu shot.   PHQ/GAD elevated.  Discussed conservative management. Discussed medications. Pt would like to start medication. Start celexa. Discussed SE's. Follow up in 6-8 weeks. Continue therapy.  See  After Visit Summary for Counseling Recommendations

## 2020-05-28 NOTE — Patient Instructions (Addendum)
Health Maintenance, Female Adopting a healthy lifestyle and getting preventive care are important in promoting health and wellness. Ask your health care provider about:  The right schedule for you to have regular tests and exams.  Things you can do on your own to prevent diseases and keep yourself healthy. What should I know about diet, weight, and exercise? Eat a healthy diet   Eat a diet that includes plenty of vegetables, fruits, low-fat dairy products, and lean protein.  Do not eat a lot of foods that are high in solid fats, added sugars, or sodium. Maintain a healthy weight Body mass index (BMI) is used to identify weight problems. It estimates body fat based on height and weight. Your health care provider can help determine your BMI and help you achieve or maintain a healthy weight. Get regular exercise Get regular exercise. This is one of the most important things you can do for your health. Most adults should:  Exercise for at least 150 minutes each week. The exercise should increase your heart rate and make you sweat (moderate-intensity exercise).  Do strengthening exercises at least twice a week. This is in addition to the moderate-intensity exercise.  Spend less time sitting. Even light physical activity can be beneficial. Watch cholesterol and blood lipids Have your blood tested for lipids and cholesterol at 30 years of age, then have this test every 5 years. Have your cholesterol levels checked more often if:  Your lipid or cholesterol levels are high.  You are older than 30 years of age.  You are at high risk for heart disease. What should I know about cancer screening? Depending on your health history and family history, you may need to have cancer screening at various ages. This may include screening for:  Breast cancer.  Cervical cancer.  Colorectal cancer.  Skin cancer.  Lung cancer. What should I know about heart disease, diabetes, and high blood  pressure? Blood pressure and heart disease  High blood pressure causes heart disease and increases the risk of stroke. This is more likely to develop in people who have high blood pressure readings, are of African descent, or are overweight.  Have your blood pressure checked: ? Every 3-5 years if you are 18-39 years of age. ? Every year if you are 40 years old or older. Diabetes Have regular diabetes screenings. This checks your fasting blood sugar level. Have the screening done:  Once every three years after age 40 if you are at a normal weight and have a low risk for diabetes.  More often and at a younger age if you are overweight or have a high risk for diabetes. What should I know about preventing infection? Hepatitis B If you have a higher risk for hepatitis B, you should be screened for this virus. Talk with your health care provider to find out if you are at risk for hepatitis B infection. Hepatitis C Testing is recommended for:  Everyone born from 1945 through 1965.  Anyone with known risk factors for hepatitis C. Sexually transmitted infections (STIs)  Get screened for STIs, including gonorrhea and chlamydia, if: ? You are sexually active and are younger than 30 years of age. ? You are older than 30 years of age and your health care provider tells you that you are at risk for this type of infection. ? Your sexual activity has changed since you were last screened, and you are at increased risk for chlamydia or gonorrhea. Ask your health care provider if   you are at risk.  Ask your health care provider about whether you are at high risk for HIV. Your health care provider may recommend a prescription medicine to help prevent HIV infection. If you choose to take medicine to prevent HIV, you should first get tested for HIV. You should then be tested every 3 months for as long as you are taking the medicine. Pregnancy  If you are about to stop having your period (premenopausal) and  you may become pregnant, seek counseling before you get pregnant.  Take 400 to 800 micrograms (mcg) of folic acid every day if you become pregnant.  Ask for birth control (contraception) if you want to prevent pregnancy. Osteoporosis and menopause Osteoporosis is a disease in which the bones lose minerals and strength with aging. This can result in bone fractures. If you are 67 years old or older, or if you are at risk for osteoporosis and fractures, ask your health care provider if you should:  Be screened for bone loss.  Take a calcium or vitamin D supplement to lower your risk of fractures.  Be given hormone replacement therapy (HRT) to treat symptoms of menopause. Follow these instructions at home: Lifestyle  Do not use any products that contain nicotine or tobacco, such as cigarettes, e-cigarettes, and chewing tobacco. If you need help quitting, ask your health care provider.  Do not use street drugs.  Do not share needles.  Ask your health care provider for help if you need support or information about quitting drugs. Alcohol use  Do not drink alcohol if: ? Your health care provider tells you not to drink. ? You are pregnant, may be pregnant, or are planning to become pregnant.  If you drink alcohol: ? Limit how much you use to 0-1 drink a day. ? Limit intake if you are breastfeeding.  Be aware of how much alcohol is in your drink. In the [place] one drink equals one 12 oz bottle of beer (355 mL), one 5 oz glass of wine (148 mL), or one 1 oz glass of hard liquor (44 mL). General instructions  Schedule regular health, dental, and eye exams.  Stay current with your vaccines.  Tell your health care provider if: ? You often feel depressed. ? You have ever been abused or do not feel safe at home. Summary  Adopting a healthy lifestyle and getting preventive care are important in promoting health and wellness.  Follow your health care provider's instructions about healthy  diet, exercising, and getting tested or screened for diseases.  Follow your health care provider's instructions on monitoring your cholesterol and blood pressure. This information is not intended to replace advice given to you by your health care provider. Make sure you discuss any questions you have with your health care provider. Document Revised: 06/08/2018 Document Reviewed: 06/08/2018 Elsevier Patient Education  [place].    Constipation, Adult Constipation is when a person:  Poops (has a bowel movement) fewer times in a week than normal.  Has a hard time pooping.  Has poop that is dry, hard, or bigger than normal. Follow these instructions at home: Eating and drinking   Eat foods that have a lot of fiber, such as: ? Fresh fruits and vegetables. ? Whole grains. ? Beans.  Eat less of foods that are high in fat, low in fiber, or overly processed, such as: ? [place] fries. ? Hamburgers. ? Cookies. ? Candy. ? Soda.  Drink enough fluid to keep your pee (urine) clear  or pale yellow. General instructions  Exercise regularly or as told by your doctor.  Go to the restroom when you feel like you need to poop. Do not hold it in.  Take over-the-counter and prescription medicines only as told by your doctor. These include any fiber supplements.  Do pelvic floor retraining exercises, such as: ? Doing deep breathing while relaxing your lower belly (abdomen). ? Relaxing your pelvic floor while pooping.  Watch your condition for any changes.  Keep all follow-up visits as told by your doctor. This is important. Contact a doctor if:  You have pain that gets worse.  You have a fever.  You have not pooped for 4 days.  You throw up (vomit).  You are not hungry.  You lose weight.  You are bleeding from the anus.  You have thin, pencil-like poop (stool). Get help right away if:  You have a fever, and your symptoms suddenly get worse.  You leak poop or have  blood in your poop.  Your belly feels hard or bigger than normal (is bloated).  You have very bad belly pain.  You feel dizzy or you faint. This information is not intended to replace advice given to you by your health care provider. Make sure you discuss any questions you have with your health care provider. Document Revised: 05/28/2017 Document Reviewed: 12/04/2015 Elsevier Patient Education  2020 [employer].

## 2020-05-29 LAB — COMPLETE METABOLIC PANEL WITH GFR
AG Ratio: 1.3 (calc) (ref 1.0–2.5)
ALT: 17 U/L (ref 6–29)
AST: 15 U/L (ref 10–30)
Albumin: 3.7 g/dL (ref 3.6–5.1)
Alkaline phosphatase (APISO): 51 U/L (ref 31–125)
BUN: 8 mg/dL (ref 7–25)
CO2: 25 mmol/L (ref 20–32)
Calcium: 8.6 mg/dL (ref 8.6–10.2)
Chloride: 107 mmol/L (ref 98–110)
Creat: 0.85 mg/dL (ref 0.50–1.10)
GFR, Est African American: 107 mL/min/{1.73_m2} (ref >=60)
GFR, Est Non African American: 92 mL/min/{1.73_m2} (ref >=60)
Globulin: 2.9 g/dL (calc) (ref 1.9–3.7)
Glucose, Bld: 89 mg/dL (ref 65–99)
Potassium: 4 mmol/L (ref 3.5–5.3)
Sodium: 138 mmol/L (ref 135–146)
Total Bilirubin: 0.2 mg/dL (ref 0.2–1.2)
Total Protein: 6.6 g/dL (ref 6.1–8.1)

## 2020-05-29 LAB — TSH: TSH: 0.79 mIU/L

## 2020-05-29 LAB — LIPID PANEL W/REFLEX DIRECT LDL
Cholesterol: 172 mg/dL (ref <200)
HDL: 61 mg/dL (ref >=50)
LDL Cholesterol (Calc): 98 mg/dL (calc)
Non-HDL Cholesterol (Calc): 111 mg/dL (calc) (ref <130)
Total CHOL/HDL Ratio: 2.8 (calc) (ref <5.0)
Triglycerides: 50 mg/dL (ref <150)

## 2020-05-29 LAB — CBC WITH DIFFERENTIAL/PLATELET
Absolute Monocytes: 435 cells/uL (ref 200–950)
Basophils Absolute: 20 cells/uL (ref 0–200)
Basophils Relative: 0.4 %
Eosinophils Absolute: 180 cells/uL (ref 15–500)
Eosinophils Relative: 3.6 %
HCT: 36.6 % (ref 35.0–45.0)
Hemoglobin: 12.4 g/dL (ref 11.7–15.5)
Lymphs Abs: 1800 cells/uL (ref 850–3900)
MCH: 31 pg (ref 27.0–33.0)
MCHC: 33.9 g/dL (ref 32.0–36.0)
MCV: 91.5 fL (ref 80.0–100.0)
MPV: 13.4 fL — ABNORMAL HIGH (ref 7.5–12.5)
Monocytes Relative: 8.7 %
Neutro Abs: 2565 cells/uL (ref 1500–7800)
Neutrophils Relative %: 51.3 %
Platelets: 170 10*3/uL (ref 140–400)
RBC: 4 10*6/uL (ref 3.80–5.10)
RDW: 12.6 % (ref 11.0–15.0)
Total Lymphocyte: 36 %
WBC: 5 10*3/uL (ref 3.8–10.8)

## 2020-05-29 LAB — C. TRACHOMATIS/N. GONORRHOEAE RNA
C. trachomatis RNA, TMA: NOT DETECTED
N. gonorrhoeae RNA, TMA: NOT DETECTED

## 2020-05-29 LAB — HIV ANTIBODY (ROUTINE TESTING W REFLEX): HIV 1&2 Ab, 4th Generation: NONREACTIVE

## 2020-05-29 LAB — HEPATITIS C ANTIBODY
Hepatitis C Ab: NONREACTIVE
SIGNAL TO CUT-OFF: 0.01 (ref <1.00)

## 2020-05-29 LAB — RPR: RPR Ser Ql: NONREACTIVE

## 2020-05-29 NOTE — Progress Notes (Signed)
Call pt:  Labs look great.  No anemia.  Kidney, liver, glucose look great.  Cholesterol better than 1 year ago and at optimal level!

## 2020-05-29 NOTE — Progress Notes (Signed)
Negative for STI.

## 2020-06-05 ENCOUNTER — Ambulatory Visit (INDEPENDENT_AMBULATORY_CARE_PROVIDER_SITE_OTHER): Payer: BC Managed Care – PPO | Admitting: Physician Assistant

## 2020-06-05 ENCOUNTER — Other Ambulatory Visit: Payer: Self-pay

## 2020-06-05 ENCOUNTER — Encounter: Payer: Self-pay | Admitting: Physician Assistant

## 2020-06-05 VITALS — BP 123/81 | HR 92 | Ht 67.0 in | Wt 191.0 lb

## 2020-06-05 DIAGNOSIS — N898 Other specified noninflammatory disorders of vagina: Secondary | ICD-10-CM

## 2020-06-05 DIAGNOSIS — F439 Reaction to severe stress, unspecified: Secondary | ICD-10-CM

## 2020-06-05 DIAGNOSIS — N760 Acute vaginitis: Secondary | ICD-10-CM | POA: Diagnosis not present

## 2020-06-05 DIAGNOSIS — F419 Anxiety disorder, unspecified: Secondary | ICD-10-CM | POA: Diagnosis not present

## 2020-06-05 DIAGNOSIS — F32A Depression, unspecified: Secondary | ICD-10-CM | POA: Insufficient documentation

## 2020-06-05 DIAGNOSIS — B9689 Other specified bacterial agents as the cause of diseases classified elsewhere: Secondary | ICD-10-CM | POA: Insufficient documentation

## 2020-06-05 MED ORDER — METRONIDAZOLE 500 MG PO TABS: 500.0000 mg | ORAL_TABLET | Freq: Two times a day (BID) | ORAL | 0 refills | Status: DC

## 2020-06-05 MED ORDER — FLUCONAZOLE 150 MG PO TABS: 150.0000 mg | ORAL_TABLET | Freq: Once | ORAL | 0 refills | Status: AC

## 2020-06-05 MED ORDER — FLUCONAZOLE 150 MG PO TABS: 150.0000 mg | ORAL_TABLET | Freq: Once | ORAL | 0 refills | Status: DC

## 2020-06-05 NOTE — Progress Notes (Signed)
Subjective:    Patient ID: Kendra Boone, female    DOB: 28-May-1991, 29 y.o.   MRN: [id number]  HPI  Patient is a 29 year old female who presents to the clinic with vaginal discharge and odor for the last 2 days.  Patient has a history of bacterial vaginosis.  The only thing that has changed is starting Celexa.  She denies any abdominal pain, flank pain, dysuria.  Since starting Celexa for the last 2 weeks her sleeping has been better.  She feels like she could be a little more anxious during the day.  She denies any suicidal thoughts or homicidal idealizations.  .. Active Ambulatory Problems    Diagnosis Date Noted  . Vitamin D deficiency 08/12/2015  . Serum sodium decreased 08/12/2015  . Low iron stores 08/12/2015  . Thyromegaly 08/12/2015  . Trouble swallowing 08/12/2015  . Dry mouth 08/12/2015  . Generalized edema 08/12/2015  . Bilateral hip pain 08/12/2015  . Abnormal weight gain 08/12/2015  . Depression 08/12/2015  . Arthralgia of both lower legs 08/12/2015  . Vaginal discharge 08/12/2015  . Peroneal tendinitis of left lower extremity 02/13/2017  . Palpitations 10/11/2017  . Elevated blood pressure reading 09/05/2018  . Stress 09/05/2018  . Primary insomnia 09/05/2018  . Decreased thyroid stimulating hormone (TSH) level 04/21/2019  . Class 1 obesity due to excess calories without serious comorbidity with body mass index (BMI) of 32.0 to 32.9 in adult 04/24/2019  . Positive depression screening 04/24/2019  . Anxiety and depression 06/05/2020  . Bacterial vaginosis 06/05/2020   Resolved Ambulatory Problems    Diagnosis Date Noted  . No Resolved Ambulatory Problems   No Additional Past Medical History     Review of Systems See HPI.     Objective:   Physical Exam Vitals reviewed.  Constitutional:      Appearance: Normal appearance.  Cardiovascular:     Rate and Rhythm: Normal rate and regular rhythm.     Pulses: Normal pulses.  Pulmonary:     Effort:  Pulmonary effort is normal.  Abdominal:     General: Bowel sounds are normal. There is no distension.     Palpations: Abdomen is soft.     Tenderness: There is no abdominal tenderness. There is no right CVA tenderness, left CVA tenderness, guarding or rebound.  Neurological:     General: No focal deficit present.     Mental Status: She is alert and oriented to person, place, and time.  Psychiatric:        Mood and Affect: Mood normal.           Assessment & Plan:  Marland KitchenMarland KitchenDontrice was seen today for vaginal discharge.  Diagnoses and all orders for this visit:  Bacterial vaginosis -     metroNIDAZOLE (FLAGYL) 500 MG tablet; Take 1 tablet (500 mg total) by mouth 2 (two) times daily. For 7 days. -     fluconazole (DIFLUCAN) 150 MG tablet; Take 1 tablet (150 mg total) by mouth once for 1 dose. -     Cancel: WET PREP FOR TRICH, YEAST, CLUE  Vaginal discharge -     metroNIDAZOLE (FLAGYL) 500 MG tablet; Take 1 tablet (500 mg total) by mouth 2 (two) times daily. For 7 days. -     fluconazole (DIFLUCAN) 150 MG tablet; Take 1 tablet (150 mg total) by mouth once for 1 dose.  Vaginal odor -     metroNIDAZOLE (FLAGYL) 500 MG tablet; Take 1 tablet (500 mg total) by mouth 2 (  two) times daily. For 7 days. -     fluconazole (DIFLUCAN) 150 MG tablet; Take 1 tablet (150 mg total) by mouth once for 1 dose.  Stress  Anxiety and depression  Other orders -     Discontinue: metroNIDAZOLE (FLAGYL) 500 MG tablet; Take 1 tablet (500 mg total) by mouth 2 (two) times daily. For 7 days. -     Discontinue: fluconazole (DIFLUCAN) 150 MG tablet; Take 1 tablet (150 mg total) by mouth once for 1 dose.   Too soon to stop celexa. It is helping her sleep. Follow up in 2 more weeks to see how anxiety and depression are doing. If worsening please reach out sooner.   Smell and discharge seem consistent with BV. Treated empirically with metronidazole. Hx of yeast after taking metronidazole. Sent diflucan. Discussed  recurrent BV. Consider boric acid suppositories or replens probiotic.

## 2020-06-07 ENCOUNTER — Encounter: Payer: Self-pay | Admitting: Physician Assistant

## 2020-07-29 ENCOUNTER — Telehealth: Payer: BC Managed Care – PPO | Admitting: Physician Assistant

## 2020-07-30 ENCOUNTER — Telehealth: Payer: Self-pay | Admitting: Neurology

## 2020-07-30 ENCOUNTER — Encounter: Payer: Self-pay | Admitting: Physician Assistant

## 2020-07-30 ENCOUNTER — Telehealth (INDEPENDENT_AMBULATORY_CARE_PROVIDER_SITE_OTHER): Payer: BC Managed Care – PPO | Admitting: Physician Assistant

## 2020-07-30 VITALS — Temp 98.3°F | Ht 67.0 in | Wt 185.0 lb

## 2020-07-30 DIAGNOSIS — F32A Depression, unspecified: Secondary | ICD-10-CM | POA: Diagnosis not present

## 2020-07-30 DIAGNOSIS — F419 Anxiety disorder, unspecified: Secondary | ICD-10-CM | POA: Diagnosis not present

## 2020-07-30 DIAGNOSIS — F5101 Primary insomnia: Secondary | ICD-10-CM | POA: Diagnosis not present

## 2020-07-30 NOTE — Progress Notes (Signed)
Patient ID: Kendra Boone, female   DOB: Aug 27, 1990, 30 y.o.   MRN: [id number] .Marland KitchenVirtual Visit via Video Note  I connected with Kendra Boone on 07/30/20 at  2:00 PM EST by a video enabled telemedicine application and verified that I am speaking with the correct person using two identifiers.  Location: Patient: home Provider: clinic  .Marland KitchenParticipating in visit:  Patient: Kendra Boone Provider: Iran Planas PA-C Provider in training: Martin Majestic PA-Student   I discussed the limitations of evaluation and management by telemedicine and the availability of in person appointments. The patient expressed understanding and agreed to proceed.  History of Present Illness: Pt is a 30 yo female with anxiety and depression who presents to the clinic to discuss mood.  Patient tried Celexa over the past few months and it initially helped with sleep but did not help with anxiety and depression.  She felt more anxious during the day.  She weaned herself off but now her anxiety and depression are worsened.  She wonders what else she can try.  She denies any suicidal thoughts or homicidal idealizations.  With her mother and father are all medications for anxiety.  She is not aware of what they are taking.  She continues in counseling for the last 3 years.  She is very active.  She feels like she does need some type of medication to control her symptoms.  .. Active Ambulatory Problems    Diagnosis Date Noted  . Vitamin D deficiency 08/12/2015  . Serum sodium decreased 08/12/2015  . Low iron stores 08/12/2015  . Thyromegaly 08/12/2015  . Trouble swallowing 08/12/2015  . Dry mouth 08/12/2015  . Generalized edema 08/12/2015  . Bilateral hip pain 08/12/2015  . Abnormal weight gain 08/12/2015  . Depression 08/12/2015  . Arthralgia of both lower legs 08/12/2015  . Vaginal discharge 08/12/2015  . Peroneal tendinitis of left lower extremity 02/13/2017  . Palpitations 10/11/2017  . Elevated blood pressure  reading 09/05/2018  . Stress 09/05/2018  . Primary insomnia 09/05/2018  . Decreased thyroid stimulating hormone (TSH) level 04/21/2019  . Class 1 obesity due to excess calories without serious comorbidity with body mass index (BMI) of 32.0 to 32.9 in adult 04/24/2019  . Positive depression screening 04/24/2019  . Anxiety and depression 06/05/2020  . Bacterial vaginosis 06/05/2020   Resolved Ambulatory Problems    Diagnosis Date Noted  . No Resolved Ambulatory Problems   No Additional Past Medical History   Reviewed med, allergy, problem list.    Observations/Objective: No acute distress Normal mood and appearance  .Marland Kitchen Today's Vitals   07/30/20 1330  Temp: 98.3 F (36.8 C)  TempSrc: Oral  Weight: 185 lb (83.9 kg)  Height: 5\' 7"  (1.702 m)   Body mass index is 28.98 kg/m.  .. Depression screen [facility] 2/9 07/30/2020 05/28/2020 04/19/2019 10/11/2017 02/24/2017  Decreased Interest 0 1 1 1 1   Down, Depressed, Hopeless 1 1 1 1 1   PHQ - 2 Score 1 2 2 2 2   Altered sleeping 3 3 3 2  -  Tired, decreased energy 3 2 2 3  -  Change in appetite 2 1 2 1  -  Feeling bad or failure about yourself  0 0 0 0 -  Trouble concentrating 3 0 1 0 -  Moving slowly or fidgety/restless 2 0 0 0 -  Suicidal thoughts 0 0 0 0 -  PHQ-9 Score 14 8 10 8  -  Difficult doing work/chores Very difficult Somewhat difficult Somewhat difficult Somewhat difficult -   .Marland Kitchen  GAD 7 : Generalized Anxiety Score 07/30/2020 05/28/2020 04/19/2019 10/11/2017  Nervous, Anxious, on Edge 3 3 3 1   Control/stop worrying 2 1 2  0  Worry too much - different things 2 1 2 1   Trouble relaxing 2 3 2 1   Restless 1 1 0 0  Easily annoyed or irritable 1 1 1 1   Afraid - awful might happen 0 0 0 0  Total GAD 7 Score 11 10 10 4   Anxiety Difficulty Very difficult Somewhat difficult Somewhat difficult Somewhat difficult      Assessment and Plan: Marland KitchenMarland KitchenDontrice was seen today for follow-up.  Diagnoses and all orders for this visit:  Anxiety and  depression  Primary insomnia   PHQ and GAD numbers are not to goal. Discussed options. She will see what her mother and father are on. Consider gene sight testing to see what her body would do better on. Considering zoloft to try next. Continue counseling and regular exercise.    Follow Up Instructions:    I discussed the assessment and treatment plan with the patient. The patient was provided an opportunity to ask questions and all were answered. The patient agreed with the plan and demonstrated an understanding of the instructions.   The patient was advised to call back or seek an in-person evaluation if the symptoms worsen or if the condition fails to improve as anticipated.   Iran Planas, PA-C

## 2020-07-30 NOTE — Progress Notes (Signed)
Weaned off Citalopram - making her more anxious PHQ9 (14) -GAD7 (11) completed.

## 2020-07-30 NOTE — Telephone Encounter (Signed)
Genesight testing ordered.  Tima Abad's order has been placed.  We will ship a kit to them within 48 hours.  For more information about GeneSight at Home, visit [email or web address].

## 2020-07-31 MED ORDER — BUSPIRONE HCL 5 MG PO TABS: 5.0000 mg | ORAL_TABLET | Freq: Two times a day (BID) | ORAL | 2 refills | Status: DC

## 2020-08-23 DIAGNOSIS — F419 Anxiety disorder, unspecified: Secondary | ICD-10-CM | POA: Diagnosis not present

## 2020-08-23 DIAGNOSIS — F32A Depression, unspecified: Secondary | ICD-10-CM | POA: Diagnosis not present

## 2020-08-26 ENCOUNTER — Other Ambulatory Visit: Payer: Self-pay | Admitting: Physician Assistant

## 2020-09-06 DIAGNOSIS — Z20822 Contact with and (suspected) exposure to covid-19: Secondary | ICD-10-CM | POA: Diagnosis not present

## 2020-09-06 DIAGNOSIS — Z03818 Encounter for observation for suspected exposure to other biological agents ruled out: Secondary | ICD-10-CM | POA: Diagnosis not present

## 2020-09-19 ENCOUNTER — Telehealth: Payer: Self-pay | Admitting: Neurology

## 2020-09-19 NOTE — Telephone Encounter (Signed)
Left message on machine for patient to call back to make an appt to discuss genesight testing results.   [facility] Pharmacogenomic Test Kendra Boone Date of Birth: 07-14-1990 Clinician: Iran Planas   Order Number:  Report Date: Reference: [id number] 08/27/2020 Questions about report interpretation? Contact our Medical Information team: [phone]  [email or web address]  Antidepressants  Use as Directed amitriptyline (Elavil) bupropion (Wellbutrin) clomipramine (Anafranil) desipramine (Norpramin) desvenlafaxine (Pristiq) duloxetine (Cymbalta) fluoxetine (Prozac) fluvoxamine (Luvox) levomilnacipran (Fetzima) mirtazapine (Remeron) nortriptyline (Pamelor) paroxetine (Paxil) trazodone (Desyrel) venlafaxine (Effexor) vilazodone (Viibryd) vortioxetine (Trintellix)  Moderate Gene-drug Interaction citalopram (Celexa)1 doxepin (Sinequan)1 escitalopram (Lexapro)1 selegiline (Emsam)1 sertraline (Zoloft)1 imipramine (Tofranil)1,6  Significant Gene-drug Interaction   Anxiolytics and Hypnotics  Use as Directed alprazolam (Xanax) buspirone (BuSpar) chlordiazepoxide (Librium) clonazepam (Klonopin) clorazepate (Tranxene) diazepam (Valium) eszopiclone (Lunesta) lorazepam (Ativan) oxazepam (Serax) propranolol (Inderal) temazepam (Restoril) zolpidem (Ambien)  Moderate Gene-drug Interaction  Significant Gene-drug Interaction   Antipsychotics  Use as Directed aripiprazole (Abilify) brexpiprazole (Rexulti) cariprazine (Vraylar) chlorpromazine (Thorazine) clozapine (Clozaril) fluphenazine (Prolixin) haloperidol (Haldol) iloperidone (Fanapt) lumateperone (Caplyta) lurasidone (Latuda) paliperidone (Invega) perphenazine (Trilafon) quetiapine (Seroquel) risperidone (Risperdal) thioridazine (Mellaril) thiothixene (Navane) ziprasidone (Geodon)  Moderate Gene-drug Interaction asenapine  (Saphris)2  Significant Gene-drug Interaction olanzapine (Zyprexa)2   Mood Stabilizers  Use as Directed carbamazepine (Tegretol) oxcarbazepine (Trileptal) valproic acid/divalproex (Depakote)   Moderate Gene-drug Interaction   Significant Gene-drug Interaction lamotrigine (Lamictal)2   No Proven Genetic Markers gabapentin (Neurontin)10 lithium (Eskalith)10 topiramate (Topamax)10   Stimulants  Use as Directed dexmethylphenidate (Focalin) methylphenidate (Ritalin, Concerta)  Moderate Gene-drug Interaction   Significant Gene-drug Interaction  No Proven Genetic Markers amphetamine salts (Adderall)10 dextroamphetamine (Dexedrine)10 lisdexamfetamine (Vyvanse)10   Non-Stimulants  Use as Directed atomoxetine (Strattera) guanfacine (Intuniv)   Moderate Gene-drug Interaction  Significant Gene-drug Interaction  No Proven Genetic Markers clonidine (Kapvay)10   Clinical Considerations 1: Serum level may be too high, lower doses may be required. 2: Serum level may be too low, higher doses may be required. 6: Use of this drug may increase risk of side effects. 10: While this medication does not have clinically proven genetic markers that allow it to be categorized, it may be an effective choice based on other clinical factors.  All psychotropic medications require clinical monitoring. Medications should not be changed based solely on the test results. The results of this test are intended to supplement other clinical information considered by a healthcare provider within the context of a comprehensive medical evaluation. This report is not intended to imply that the drugs listed are approved for the same indications or that they are comparable in safety or efficacy. The brand name is shown for illustrative purposes only; other brand names may be available. The prescribing physician should review the prescribing information for the drug(s)  being considered and make treatment decisions based on the patient's individual needs and the characteristics of the drug prescribed. Propranolol and oxcarbazepine might be considered off-label when being used for neuropsychiatric disorders. Please consult their respective FDA drug labels for specific guidelines regarding their use.  The [facility] test interpretations are based on a thorough review of published peer-reviewed literature, internal research, and FDA label information when applicable. The clinical validity and utility of the [facility] test have been evaluated for patients with major depressive disorder who failed at least one psychotropic medication in multiple clinical studies. Patient Genotypes and Phenotypes Pharmacodynamic Genes PD ADRA2ANormal Response C/G This patient is heterozygous for the -1291G>C polymorphism in the adrenergic alpha-2A receptor gene. They have one copy of the C  allele and one copy of the G allele. This genotype suggests a normal response to certain ADHD medications.  HLA-A*3101Lower Risk A/A This patient is homozygous for the A allele of the [id number] A>T polymorphism indicating absence of the HLA-A*3101 allele. This genotype suggests a lower risk of serious hypersensitivity reactions, including Stevens-Johnson syndrome (SJS), toxic epidermal necrolysis (TEN), maculopapular eruptions, and Drug Reaction with Eosinophilia and Systemic Symptoms when taking certain mood stabilizers.  HLA-B*1502Lower Risk Not Present This patient does not carry the HLA-B*1502 allele or a closely related *15 allele. Absence of HLA-B*1502 and the closely related *15 alleles suggests lower risk of serious dermatologic reactions including toxic epidermal necrolysis (TEN) and Stevens-Johnson syndrome (SJS) when taking certain mood stabilizers.  HTR2ANormal Sensitivity G/A This individual is heterozygous for the G allele and A allele of the -1438G>A  polymorphism for the Serotonin Receptor Type 2A. They have one copy of the G allele. This genotype is not predictive of adverse drug reactions with selective serotonin reuptake inhibitors.  SLC6A4Normal Response L/L This patient is homozygous for the long promoter polymorphism of the serotonin transporter gene. The long promoter allele is reported to express normal levels of the serotonin transporter. The patient is predicted to have a normal response to selective serotonin reuptake inhibitors.  Pharmacokinetic Genes PK CES1A1Extensive (Normal) Metabolizer GLY/GLY CES1A1 - Gly allele enzyme activity: Normal CES1A1 - Gly allele enzyme activity: Normal  This genotype is most consistent with the extensive (normal) metabolizer phenotype.  The patient is expected to have normal enzyme activity.  CYP1A2Extensive (Normal) Metabolizer *1/*1 This genotype is most consistent with the extensive (normal) metabolizer phenotype.  CYP2B6Extensive (Normal) Metabolizer *1/*1 CYP2B6*1 allele enzyme activity: Normal CYP2B6*1 allele enzyme activity: Normal  This genotype is most consistent with the extensive (normal) metabolizer phenotype.  CYP2C19Intermediate Metabolizer *1/*2 CYP2C19*1 allele enzyme activity: Normal CYP2C19*2 allele enzyme activity: None  This genotype is most consistent with the intermediate metabolizer phenotype. This patient may have reduced enzyme activity as compared to individuals with the normal phenotype.  CYP2C9Extensive (Normal) Metabolizer *1/*1 CYP2C9*1 allele enzyme activity: Normal CYP2C9*1 allele enzyme activity: Normal  This genotype is most consistent with the extensive (normal) metabolizer phenotype.  CYP2D6Extensive (Normal) Metabolizer *1/*2 CYP2D6*1 allele enzyme activity: Normal CYP2D6*2 allele enzyme activity: Normal  This genotype is most consistent with the extensive (normal) metabolizer phenotype.  CYP3A4Extensive (Normal)  Metabolizer *1/*1 CYP3A4*1 allele enzyme activity: Normal CYP3A4*1 allele enzyme activity: Normal  This genotype is most consistent with the extensive (normal) metabolizer phenotype.  UGT1A4Ultrarapid Metabolizer *1/*3 UGT1A4*1 allele enzyme activity: Normal UGT1A4*3 allele enzyme activity: Increased  This genotype is most consistent with the ultrarapid metabolizer phenotype. This patient may have increased enzyme activity as compared to individuals with the normal phenotype.  UGT2B15Extensive (Normal) Metabolizer *1/*1 UGT2B15*1 allele enzyme activity: Normal UGT2B15*1 allele enzyme activity: Normal  This genotype is most consistent with the extensive metabolizer (normal) phenotype.  The patient is expected to have normal enzyme activity.  Additional Genotypes Not Included in Categorizing Medications Genotypes reported in this section have not been shown to be reliable markers of medication outcomes COMT VAL/MET This patient is heterozygous for the Val158Met polymorphism in the catechol-o-methyltransferase gene. They have one copy of the Met allele and one copy of the Val allele.  A summary of the studies that have assessed the potential effect of COMT genotype on response to psychotropic medications can be found here: ConnectAnalyst.se  To categorize medications on this pharmacogenomic test, a gene must have a variant that has been shown to  have a significant impact on medication outcomes, as demonstrated in multiple well-designed studies. Studies assessing the gene in this section have not shown that it is a reliable marker of medication outcomes. Therefore, this gene does not currently meet the criteria for categorizing medications. The patient's genotype is provided for informational purposes only. Gene-drug Interactions Use as  Directed CES1A1 Normal CYP1A2 Normal CYP2B6 Normal CYP2C19 Intermediate CYP2C9 Normal CYP2D6 Normal CYP3A4 Normal UGT1A4 Ultrarapid UGT2B15 Normal Antidepressants          amitriptyline (Elavil)     * bupropion (Wellbutrin)          clomipramine (Anafranil)     * desipramine (Norpramin)          desvenlafaxine (Pristiq)          duloxetine (Cymbalta)          fluoxetine (Prozac)          fluvoxamine (Luvox)          levomilnacipran (Fetzima)          mirtazapine (Remeron)          nortriptyline (Pamelor)          paroxetine (Paxil)          trazodone (Desyrel)          venlafaxine (Effexor)          vilazodone (Viibryd)          vortioxetine (Trintellix)          Anxiolytics and Hypnotics          alprazolam (Xanax)          buspirone (BuSpar)          chlordiazepoxide (Librium)          clonazepam (Klonopin)          clorazepate (Tranxene)          diazepam (Valium)          eszopiclone (Lunesta)          lorazepam (Ativan)          oxazepam (Serax)          propranolol (Inderal)          temazepam (Restoril)          zolpidem (Ambien)          Antipsychotics          aripiprazole (Abilify)          brexpiprazole (Rexulti)          cariprazine (Vraylar)          chlorpromazine (Thorazine)          clozapine (Clozaril)          fluphenazine (Prolixin)          haloperidol (Haldol)          iloperidone (Fanapt)          lumateperone (Caplyta)          lurasidone (Latuda)          paliperidone (Invega)          perphenazine (Trilafon)          quetiapine (Seroquel)          risperidone (Risperdal)          thioridazine (Mellaril)          thiothixene (Navane)          ziprasidone (Geodon)          Mood Stabilizers  carbamazepine (Tegretol)          oxcarbazepine (Trileptal)          valproic acid/divalproex (Depakote)          Stimulants          dexmethylphenidate  (Focalin)          methylphenidate (Ritalin, Concerta)          Non-Stimulants          atomoxetine (Strattera)          guanfacine (Intuniv)          Moderate Gene-Drug Interaction CES1A1 Normal CYP1A2 Normal CYP2B6 Normal CYP2C19 Intermediate CYP2C9 Normal CYP2D6 Normal CYP3A4 Normal UGT1A4 Ultrarapid UGT2B15 Normal Antidepressants          citalopram (Celexa)     * doxepin (Sinequan)     * escitalopram (Lexapro)     * imipramine (Tofranil)     * selegiline (Emsam)          sertraline (Zoloft)     * Antipsychotics          asenapine (Saphris)          Significant Gene-Drug Interaction CES1A1 Normal CYP1A2 Normal CYP2B6 Normal CYP2C19 Intermediate CYP2C9 Normal CYP2D6 Normal CYP3A4 Normal UGT1A4 Ultrarapid UGT2B15 Normal Antipsychotics          olanzapine (Zyprexa)          Mood Stabilizers          lamotrigine (Lamictal)          No Proven Genetic Markers CES1A1 Normal CYP1A2 Normal CYP2B6 Normal CYP2C19 Intermediate CYP2C9 Normal CYP2D6 Normal CYP3A4 Normal UGT1A4 Ultrarapid UGT2B15 Normal Stimulants          amphetamine salts (Adderall)          Variation was found in patient genotype that may impact medication metabolism. *This gene-drug interaction is recognized by the FDA or CPIC. This gene is associated with medication metabolism, but the predicted patient phenotype is normal. Test Information The buccal swab sample was collected on 08/23/2020 and received in the laboratory on 08/26/2020. Genomic DNA was isolated and the relevant genomic regions were amplified by polymerase chain reaction (PCR). Analysis of CYP2D6 deletion and duplication, DEY-C*1448 and SLC6A4 was completed by electrophoresis of PCR products. Analysis of [id number] (indicating presence of the WYO-V*7858 allele or certain IFO-Y*77 alleles), ADRA2A, CES1A1, COMT, CYP1A2, CYP2B6, CYP2C19, CYP2C9, CYP2D6, CYP3A4, HTR2A, UGT1A4 and UGT2B15 was  completed by using [employer]). The following genetic variants may be detected in the assay: ADRA2A -1291C>G (NM_000681.3:c.-1252C>G); CES1A1 (NM_001025194.1:c.428G>A); COMT Val158Met (NM_007310.2:c.322G>A); CYP1A2 -3860G>A (NG_008431.1:g.28338G>A), -2467T>delT (NM_000761.4:c.-1635delT), -739T>G (NM_000761.4:c.-10+103T>G), -729C>T (NM_000761.4:c.-10+113C>T), -163C>A (NM_000761.4:c.-9-154C>A), 125C>G (NM_000761.4:c.125C>G), 558C>A (NM_000761.4:c.558C>A), 2116G>A (NM_000761.4:c.1042G>A), 2473G>A (NM_000761.4:c.1130G>A), 2499A>T (NM_000761.4:c.1156A>T), 3497G>A (NM_000761.4:c.1217G>A), 3533G>A (NM_000761.4:c.1253+1G>A), 5090C>T (NM_000761.4:c.1291C>T), 5166G>A (NM_000761.4:c.1367G>A), 5347C>T (NM_000761.4:c.1548C>T); CYP2B6 *1, *4 (NM_000767.4:c.785A>G), *6 (NM_000767.4:c.516G>T; c.785A>G), *9 (NM_000767.4:c.516G>T); CYP2C19 *1, *2 (NM_000769.2:c.681G>A), *3 (NM_000769.2:c.636G>A), *4 (NM_000769.2:c.1A>G), *5 (NM_000769.2:c.1297C>T),*6 (NM_000769.2:c.395G>A), *7 (NM_000769.2:c.819+2T>A), *8 (NM_000769.2:c.358T>C), *17 (NM_000769.2:c.-806C>T); CYP2C9 *1, *2 (NM_000771.3:c.430C>T), *3 (NM_000771.3:c.1075A>C), *4 (NM_000771.3:c.1076T>C), *5 (NM_000771.3:c.1080C>G), *6 (NM_000771.3:c.817delA); CYP2D6 *1, *2 (NM_000106.5:c.886C>T; c.1457G>C), *2A (NM_000106.5:c.-1584C>G; c.886C>T; c.1457G>C), *3 (NM_000106.5:c.775delA), *4 (NM_000106.5:c.506-1G>A; c.100C>T; c.1457G>C), *5 (CYP2D6 Deletion), *6 (NM_000106.5:c.454delT), *7 (NM_000106.5:c.971A>C), *8 (NM_000106.5:c.505G>T; c.886C>T; c.1457G>C), *9 (NM_000106.5:c.841_843delAAG), *10 (NM_000106.5:c.100C>T; c.1457G>C), *11, *12 (NM_000106.5:c.124G>A; c.886C>T; c.1457G>C), *14 (NM_000106.5:c.505G>A; c.886C>T; c.1457G>C), *15, *17 (NM_000106.5:c.320C>T; c.886C>T; c.1457G>C), *41 (NM_000106.5:c.985+39G>A; c.886C>T; A.1287O>M), gene duplication; VEH2C9 *1, *13 (NM_017460.5:c.1247C>T), *15A (NM_017460.5:c.485G>A), *22 (NM_017460.5:c.522-191C>T);  HLA-B*1502; [id number] (NM_002116.7:c.*66A>T); HTR2A -1438G>A (NM_000621.4:c.-998G>A); SLC6A4 L, S; UGT1A4 *1, *3 (NM_007120.2:c.142T>G); UGT2B15 *1, *2 (NM_001076.3:c.253G>T). The following rare genetic variants have not been observed by  the [facility]: CYP1A2 125C>G, 558C>A; CYP2C19 *7. *1 is the reference allele and is reported by default if the other tested alleles are not detected.  This test was developed and its performance characteristics determined by [facility]. It has not been cleared or approved by the U.S. Food and Drug Administration. These interpretations are based upon data available in scientific literature and prescribing information for the relevant drugs. Interpretations are, in some instances, based on data regarding the pharmacokinetic, pharmacodynamic and pharmacogenomics properties of a drug derived from non-clinical studies (e.g. in vitro studies). Findings from studies performed in a non-clinical setting or clinical studies involving healthy subjects are not necessarily indicative of clinical performance in a particular patient. References used to inform medication categorizations can be found here: ComparePet.com.cy.  This report was reviewed and verified on 08/27/2020 by: Daine Gip, PhD Disclaimer of Liability The information contained in this report is provided as a service and does not constitute medical advice. At the time of report generation this information is believed to be current and is based upon published research; however, research data evolves and amendments to the prescribing information of the drugs listed will change over time. While this report is believed to be accurate and complete as of the date issued, THE DATA IS PROVIDED "AS IS", WITHOUT WARRANTIES OF ANY KIND, EXPRESS OR IMPLIED, INCLUDING WITHOUT LIMITATION, THE IMPLIED WARRANTIES OF MERCHANTABILITY AND FITNESS FOR A PARTICULAR PURPOSE. As medical advice must be  tailored to the specific circumstances of each case, the treating healthcare provider has ultimate responsibility for all treatment decisions made with regard to a patient including any made on the basis of a patient's genotype. [facility] is covered by U.S. Patent No. [phone] Genetic testing was completed by a CLIA and CAP accredited laboratory in the [place] located at: [place] [address] Laboratory Director: Graylon Gunning, PhD Customer Service Please contact [phone] or [email or web address] for assistance with report interpretation. For all other inquires please contact [phone] or [email or web address]. [facility] Test Version: 4.0  CONFIDENTIAL HEALTHCARE INFORMATION  2022 [employer]. doing business as [employer].

## 2020-09-27 ENCOUNTER — Encounter: Payer: Self-pay | Admitting: Physician Assistant

## 2020-09-27 ENCOUNTER — Telehealth: Payer: BC Managed Care – PPO | Admitting: Physician Assistant

## 2020-09-27 VITALS — Ht 67.0 in | Wt 185.0 lb

## 2020-09-27 DIAGNOSIS — K580 Irritable bowel syndrome with diarrhea: Secondary | ICD-10-CM

## 2020-09-27 DIAGNOSIS — R7989 Other specified abnormal findings of blood chemistry: Secondary | ICD-10-CM | POA: Diagnosis not present

## 2020-09-27 DIAGNOSIS — F411 Generalized anxiety disorder: Secondary | ICD-10-CM | POA: Diagnosis not present

## 2020-09-27 DIAGNOSIS — L659 Nonscarring hair loss, unspecified: Secondary | ICD-10-CM

## 2020-09-27 MED ORDER — BUSPIRONE HCL 10 MG PO TABS: 10.0000 mg | ORAL_TABLET | Freq: Three times a day (TID) | ORAL | 1 refills | Status: DC

## 2020-09-27 NOTE — Progress Notes (Signed)
Discuss gene study results.

## 2020-09-27 NOTE — Progress Notes (Signed)
Patient ID: Kendra Boone, female   DOB: 05-31-1991, 30 y.o.   MRN: [id number] .Marland KitchenVirtual Visit via Video Note  I connected with Kendra Boone on 09/27/20 at  9:30 AM EDT by a video enabled telemedicine application and verified that I am speaking with the correct person using two identifiers.  Location: Patient: home Provider: clinic  .Marland KitchenParticipating in visit:  Patient: Kendra Boone Provider: Iran Planas PA-C   I discussed the limitations of evaluation and management by telemedicine and the availability of in person appointments. The patient expressed understanding and agreed to proceed.  History of Present Illness: Pt is a 30 yo female with Anxiety who calls into the clinic to discuss gene sight testing.   She did feel like buspar initially helped a lot but then started noticing it just did not feel like enough.  Her anxiety was creeping 3 more more.  She denies any suicidal or homicidal thoughts.  She did start taking 2 tablets in the morning and seems to help in the morning but then she was left much more anxious in the evening.  Patient also mentions intermittent diarrhea and hair loss.  Her hair loss seems to be diffuse.  She denies any patches of hair loss.  She has not started any other new medications besides BuSpar.  She was noticing this was occurring before BuSpar.    .. Active Ambulatory Problems    Diagnosis Date Noted  . Vitamin D deficiency 08/12/2015  . Serum sodium decreased 08/12/2015  . Low iron stores 08/12/2015  . Thyromegaly 08/12/2015  . Trouble swallowing 08/12/2015  . Dry mouth 08/12/2015  . Generalized edema 08/12/2015  . Bilateral hip pain 08/12/2015  . Abnormal weight gain 08/12/2015  . Depression 08/12/2015  . Arthralgia of both lower legs 08/12/2015  . Vaginal discharge 08/12/2015  . Peroneal tendinitis of left lower extremity 02/13/2017  . Palpitations 10/11/2017  . Elevated blood pressure reading 09/05/2018  . Stress 09/05/2018  . Primary  insomnia 09/05/2018  . Decreased thyroid stimulating hormone (TSH) level 04/21/2019  . Class 1 obesity due to excess calories without serious comorbidity with body mass index (BMI) of 32.0 to 32.9 in adult 04/24/2019  . Positive depression screening 04/24/2019  . Anxiety and depression 06/05/2020  . Bacterial vaginosis 06/05/2020  . Irritable bowel syndrome with diarrhea 09/27/2020  . Hair loss 09/27/2020   Resolved Ambulatory Problems    Diagnosis Date Noted  . No Resolved Ambulatory Problems   No Additional Past Medical History   Reviewed med. Allergies, problem list.     Observations/Objective: No acute distress Normal appearance and mood.    Assessment and Plan: Marland KitchenMarland KitchenDontrice was seen today for anxiety and depression.  Diagnoses and all orders for this visit:  GAD (generalized anxiety disorder) -     busPIRone (BUSPAR) 10 MG tablet; Take 1 tablet (10 mg total) by mouth 3 (three) times daily.  Hair loss  Irritable bowel syndrome with diarrhea  Decreased thyroid stimulating hormone (TSH) level   Will check labs close to home. She is going to call back with labname and fax number.  Will order TPO/TSH/b12/cmp/cbc/ferritin/ANA  Increased buspar to 10mg  up to three times a day.   Discussed gene sight testing and interactions.   Discussed hair loss and can be cyclical. Does not appear to be autoimmune hair loss.   HO sent for telogen effluvium and gene sight results.   Follow up in 3 months    Follow Up Instructions:    I discussed the assessment  and treatment plan with the patient. The patient was provided an opportunity to ask questions and all were answered. The patient agreed with the plan and demonstrated an understanding of the instructions.   The patient was advised to call back or seek an in-person evaluation if the symptoms worsen or if the condition fails to improve as anticipated.  Iran Planas, PA-C

## 2020-09-30 NOTE — Telephone Encounter (Signed)
I don't see labs ordered on patient, did we need to order some and sent to this lab?

## 2020-09-30 NOTE — Addendum Note (Signed)
Addended byAnnamaria Helling on: 09/30/2020 02:18 PM   Modules accepted: Orders

## 2020-09-30 NOTE — Telephone Encounter (Signed)
Thank you :)

## 2020-09-30 NOTE — Telephone Encounter (Signed)
Nevermind, I saw in note what needs ordered. Ordered labs and faxed.

## 2020-10-04 DIAGNOSIS — F411 Generalized anxiety disorder: Secondary | ICD-10-CM | POA: Diagnosis not present

## 2020-10-04 DIAGNOSIS — R7989 Other specified abnormal findings of blood chemistry: Secondary | ICD-10-CM | POA: Diagnosis not present

## 2020-10-04 DIAGNOSIS — E538 Deficiency of other specified B group vitamins: Secondary | ICD-10-CM | POA: Diagnosis not present

## 2020-10-04 DIAGNOSIS — L659 Nonscarring hair loss, unspecified: Secondary | ICD-10-CM | POA: Diagnosis not present

## 2020-10-07 ENCOUNTER — Encounter: Payer: Self-pay | Admitting: Physician Assistant

## 2020-10-07 DIAGNOSIS — O280 Abnormal hematological finding on antenatal screening of mother: Secondary | ICD-10-CM | POA: Insufficient documentation

## 2020-10-07 NOTE — Progress Notes (Signed)
Some labs are pending.   Thyroid in normal range.  Hemoglobin normal but iron stores are very low. Need to start ferrous sulfate 60mg  once a day OTC.  B12 is low and barely in normal range. Start B12 1074mcg daily sublingual formula.  Recheck labs in 3 months.

## 2020-10-08 ENCOUNTER — Encounter: Payer: Self-pay | Admitting: Physician Assistant

## 2020-10-08 NOTE — Progress Notes (Signed)
No antibodies to thyroid detected.

## 2020-10-08 NOTE — Progress Notes (Signed)
Your ANA was positive. It can be positive with over 150 types of antibody responses. It can also be positive in 30 percent of people with normal variants and no autoimmune disease found. Waiting for reflux titer to give more information. Are you having any other symptoms other than hair loss?

## 2020-10-09 LAB — ANA, IFA COMPREHENSIVE PANEL
Anti Nuclear Antibody (ANA): POSITIVE — AB
ENA SM Ab Ser-aCnc: <1 AI
SM/RNP: <1 AI
SSA (Ro) (ENA) Antibody, IgG: <1 AI
SSB (La) (ENA) Antibody, IgG: <1 AI
Scleroderma (Scl-70) (ENA) Antibody, IgG: <1 AI
ds DNA Ab: <1 IU/mL

## 2020-10-09 LAB — COMPLETE METABOLIC PANEL WITH GFR
AG Ratio: 1.3 (calc) (ref 1.0–2.5)
ALT: 13 U/L (ref 6–29)
AST: 15 U/L (ref 10–30)
Albumin: 4 g/dL (ref 3.6–5.1)
Alkaline phosphatase (APISO): 54 U/L (ref 31–125)
BUN: 7 mg/dL (ref 7–25)
CO2: 27 mmol/L (ref 20–32)
Calcium: 8.8 mg/dL (ref 8.6–10.2)
Chloride: 105 mmol/L (ref 98–110)
Creat: 0.88 mg/dL (ref 0.50–1.10)
GFR, Est African American: 102 mL/min/{1.73_m2} (ref >=60)
GFR, Est Non African American: 88 mL/min/{1.73_m2} (ref >=60)
Globulin: 3.1 g/dL (calc) (ref 1.9–3.7)
Glucose, Bld: 86 mg/dL (ref 65–99)
Potassium: 3.6 mmol/L (ref 3.5–5.3)
Sodium: 139 mmol/L (ref 135–146)
Total Bilirubin: 0.4 mg/dL (ref 0.2–1.2)
Total Protein: 7.1 g/dL (ref 6.1–8.1)

## 2020-10-09 LAB — CBC WITH DIFFERENTIAL/PLATELET
Absolute Monocytes: 412 cells/uL (ref 200–950)
Basophils Absolute: 10 cells/uL (ref 0–200)
Basophils Relative: 0.2 %
Eosinophils Absolute: 103 cells/uL (ref 15–500)
Eosinophils Relative: 2.1 %
HCT: 40.7 % (ref 35.0–45.0)
Hemoglobin: 13.3 g/dL (ref 11.7–15.5)
Lymphs Abs: 1862 cells/uL (ref 850–3900)
MCH: 30.2 pg (ref 27.0–33.0)
MCHC: 32.7 g/dL (ref 32.0–36.0)
MCV: 92.3 fL (ref 80.0–100.0)
MPV: 13.1 fL — ABNORMAL HIGH (ref 7.5–12.5)
Monocytes Relative: 8.4 %
Neutro Abs: 2514 cells/uL (ref 1500–7800)
Neutrophils Relative %: 51.3 %
Platelets: 205 10*3/uL (ref 140–400)
RBC: 4.41 10*6/uL (ref 3.80–5.10)
RDW: 13 % (ref 11.0–15.0)
Total Lymphocyte: 38 %
WBC: 4.9 10*3/uL (ref 3.8–10.8)

## 2020-10-09 LAB — THYROID PEROXIDASE ANTIBODIES (TPO) (REFL): Thyroperoxidase Ab SerPl-aCnc: 1 IU/mL (ref <9)

## 2020-10-09 LAB — TSH: TSH: 0.98 mIU/L

## 2020-10-09 LAB — ANTI-NUCLEAR AB-TITER (ANA TITER): ANA Titer 1: 1:320 {titer} — ABNORMAL HIGH

## 2020-10-09 LAB — FERRITIN: Ferritin: 8 ng/mL — ABNORMAL LOW (ref 16–154)

## 2020-10-09 LAB — VITAMIN B12: Vitamin B-12: 307 pg/mL (ref 200–1100)

## 2020-10-14 ENCOUNTER — Other Ambulatory Visit: Payer: Self-pay | Admitting: Neurology

## 2020-10-14 DIAGNOSIS — R768 Other specified abnormal immunological findings in serum: Secondary | ICD-10-CM

## 2020-10-14 NOTE — Progress Notes (Signed)
Your Titer ANA is pretty elevated. Your initial autoimmune screen was negative for any findings. I would like for you to see rheumatology. I am assuming you would like referral in the [place] area. If you would find an office you would like referral to I will make it.

## 2020-11-12 DIAGNOSIS — R760 Raised antibody titer: Secondary | ICD-10-CM | POA: Diagnosis not present

## 2020-11-12 DIAGNOSIS — M3501 Sicca syndrome with keratoconjunctivitis: Secondary | ICD-10-CM | POA: Diagnosis not present

## 2020-11-12 DIAGNOSIS — R195 Other fecal abnormalities: Secondary | ICD-10-CM | POA: Diagnosis not present

## 2020-11-12 DIAGNOSIS — R634 Abnormal weight loss: Secondary | ICD-10-CM | POA: Diagnosis not present

## 2020-11-12 DIAGNOSIS — E559 Vitamin D deficiency, unspecified: Secondary | ICD-10-CM | POA: Diagnosis not present

## 2020-11-12 DIAGNOSIS — Z111 Encounter for screening for respiratory tuberculosis: Secondary | ICD-10-CM | POA: Diagnosis not present

## 2020-11-27 ENCOUNTER — Other Ambulatory Visit: Payer: Self-pay | Admitting: Physician Assistant

## 2020-11-29 DIAGNOSIS — M791 Myalgia, unspecified site: Secondary | ICD-10-CM | POA: Diagnosis not present

## 2020-11-29 DIAGNOSIS — R0789 Other chest pain: Secondary | ICD-10-CM | POA: Diagnosis not present

## 2020-11-29 DIAGNOSIS — R059 Cough, unspecified: Secondary | ICD-10-CM | POA: Diagnosis not present

## 2020-11-29 DIAGNOSIS — R0981 Nasal congestion: Secondary | ICD-10-CM | POA: Diagnosis not present

## 2020-11-29 DIAGNOSIS — U071 COVID-19: Secondary | ICD-10-CM | POA: Diagnosis not present

## 2020-12-02 ENCOUNTER — Telehealth: Payer: Self-pay | Admitting: General Practice

## 2020-12-02 NOTE — Telephone Encounter (Signed)
Transition Care Management Follow-up Telephone Call  Date of discharge and from where: [facility] 11/29/20  How have you been since you were released from the hospital? Doing ok; still having a cough but fever is gone.   Any questions or concerns? No  Items Reviewed:  Did the pt receive and understand the discharge instructions provided? Yes   Medications obtained and verified? No   Other? No   Any new allergies since your discharge? No   Dietary orders reviewed? No  Do you have support at home? Yes   Home Care and Equipment/Supplies: Were home health services ordered? no   Functional Questionnaire: (I = Independent and D = Dependent) ADLs: I  Bathing/Dressing- I  Meal Prep- I  Eating- I  Maintaining continence- I  Transferring/Ambulation- I  Managing Meds- I  Follow up appointments reviewed:   PCP Hospital f/u appt confirmed? No  Patient will call back to schedule.  [facility] f/u appt confirmed? No    Are transportation arrangements needed? No   If their condition worsens, is the pt aware to call PCP or go to the Emergency Dept.? Yes  Was the patient provided with contact information for the PCP's office or ED? Yes  Was to pt encouraged to call back with questions or concerns? Yes

## 2020-12-20 DIAGNOSIS — M3501 Sicca syndrome with keratoconjunctivitis: Secondary | ICD-10-CM | POA: Diagnosis not present

## 2020-12-20 DIAGNOSIS — R634 Abnormal weight loss: Secondary | ICD-10-CM | POA: Diagnosis not present

## 2020-12-20 DIAGNOSIS — E559 Vitamin D deficiency, unspecified: Secondary | ICD-10-CM | POA: Diagnosis not present

## 2020-12-20 DIAGNOSIS — Z6829 Body mass index (BMI) 29.0-29.9, adult: Secondary | ICD-10-CM | POA: Diagnosis not present

## 2021-01-06 DIAGNOSIS — R1013 Epigastric pain: Secondary | ICD-10-CM | POA: Diagnosis not present

## 2021-01-06 DIAGNOSIS — R1031 Right lower quadrant pain: Secondary | ICD-10-CM | POA: Diagnosis not present

## 2021-01-06 DIAGNOSIS — R197 Diarrhea, unspecified: Secondary | ICD-10-CM | POA: Diagnosis not present

## 2021-01-06 DIAGNOSIS — R112 Nausea with vomiting, unspecified: Secondary | ICD-10-CM | POA: Diagnosis not present

## 2021-01-16 DIAGNOSIS — R634 Abnormal weight loss: Secondary | ICD-10-CM | POA: Diagnosis not present

## 2021-01-16 DIAGNOSIS — R1013 Epigastric pain: Secondary | ICD-10-CM | POA: Diagnosis not present

## 2021-01-16 DIAGNOSIS — R1031 Right lower quadrant pain: Secondary | ICD-10-CM | POA: Diagnosis not present

## 2021-01-16 DIAGNOSIS — K648 Other hemorrhoids: Secondary | ICD-10-CM | POA: Diagnosis not present

## 2021-01-16 DIAGNOSIS — K922 Gastrointestinal hemorrhage, unspecified: Secondary | ICD-10-CM | POA: Diagnosis not present

## 2021-01-16 DIAGNOSIS — K529 Noninfective gastroenteritis and colitis, unspecified: Secondary | ICD-10-CM | POA: Diagnosis not present

## 2021-01-16 DIAGNOSIS — R197 Diarrhea, unspecified: Secondary | ICD-10-CM | POA: Diagnosis not present

## 2021-01-16 DIAGNOSIS — R112 Nausea with vomiting, unspecified: Secondary | ICD-10-CM | POA: Diagnosis not present

## 2021-01-24 DIAGNOSIS — E559 Vitamin D deficiency, unspecified: Secondary | ICD-10-CM | POA: Diagnosis not present

## 2021-03-17 DIAGNOSIS — E010 Iodine-deficiency related diffuse (endemic) goiter: Secondary | ICD-10-CM | POA: Diagnosis not present

## 2021-03-17 DIAGNOSIS — R102 Pelvic and perineal pain: Secondary | ICD-10-CM | POA: Diagnosis not present

## 2021-03-28 DIAGNOSIS — E010 Iodine-deficiency related diffuse (endemic) goiter: Secondary | ICD-10-CM | POA: Diagnosis not present

## 2021-04-03 ENCOUNTER — Other Ambulatory Visit: Payer: Self-pay | Admitting: Physician Assistant

## 2021-04-03 DIAGNOSIS — F411 Generalized anxiety disorder: Secondary | ICD-10-CM

## 2021-04-25 DIAGNOSIS — E559 Vitamin D deficiency, unspecified: Secondary | ICD-10-CM | POA: Diagnosis not present

## 2021-07-11 DIAGNOSIS — D2271 Melanocytic nevi of right lower limb, including hip: Secondary | ICD-10-CM | POA: Diagnosis not present

## 2021-07-11 DIAGNOSIS — D2262 Melanocytic nevi of left upper limb, including shoulder: Secondary | ICD-10-CM | POA: Diagnosis not present

## 2021-07-11 DIAGNOSIS — D225 Melanocytic nevi of trunk: Secondary | ICD-10-CM | POA: Diagnosis not present

## 2021-07-11 DIAGNOSIS — D2261 Melanocytic nevi of right upper limb, including shoulder: Secondary | ICD-10-CM | POA: Diagnosis not present

## 2021-07-21 DIAGNOSIS — E049 Nontoxic goiter, unspecified: Secondary | ICD-10-CM | POA: Diagnosis not present

## 2021-08-29 DIAGNOSIS — M5451 Vertebrogenic low back pain: Secondary | ICD-10-CM | POA: Diagnosis not present

## 2021-08-29 DIAGNOSIS — M542 Cervicalgia: Secondary | ICD-10-CM | POA: Diagnosis not present

## 2021-08-29 DIAGNOSIS — M546 Pain in thoracic spine: Secondary | ICD-10-CM | POA: Diagnosis not present

## 2021-09-05 DIAGNOSIS — M5451 Vertebrogenic low back pain: Secondary | ICD-10-CM | POA: Diagnosis not present

## 2021-09-05 DIAGNOSIS — M542 Cervicalgia: Secondary | ICD-10-CM | POA: Diagnosis not present

## 2021-09-05 DIAGNOSIS — M546 Pain in thoracic spine: Secondary | ICD-10-CM | POA: Diagnosis not present

## 2021-09-19 DIAGNOSIS — M542 Cervicalgia: Secondary | ICD-10-CM | POA: Diagnosis not present

## 2021-09-19 DIAGNOSIS — M5451 Vertebrogenic low back pain: Secondary | ICD-10-CM | POA: Diagnosis not present

## 2021-09-19 DIAGNOSIS — M546 Pain in thoracic spine: Secondary | ICD-10-CM | POA: Diagnosis not present

## 2021-09-23 DIAGNOSIS — M542 Cervicalgia: Secondary | ICD-10-CM | POA: Diagnosis not present

## 2021-09-23 DIAGNOSIS — M546 Pain in thoracic spine: Secondary | ICD-10-CM | POA: Diagnosis not present

## 2021-09-23 DIAGNOSIS — M5451 Vertebrogenic low back pain: Secondary | ICD-10-CM | POA: Diagnosis not present

## 2021-10-02 DIAGNOSIS — M546 Pain in thoracic spine: Secondary | ICD-10-CM | POA: Diagnosis not present

## 2021-10-02 DIAGNOSIS — M542 Cervicalgia: Secondary | ICD-10-CM | POA: Diagnosis not present

## 2021-10-02 DIAGNOSIS — M5451 Vertebrogenic low back pain: Secondary | ICD-10-CM | POA: Diagnosis not present

## 2021-10-13 DIAGNOSIS — M5451 Vertebrogenic low back pain: Secondary | ICD-10-CM | POA: Diagnosis not present

## 2021-10-13 DIAGNOSIS — M542 Cervicalgia: Secondary | ICD-10-CM | POA: Diagnosis not present

## 2021-10-13 DIAGNOSIS — M546 Pain in thoracic spine: Secondary | ICD-10-CM | POA: Diagnosis not present

## 2021-10-20 DIAGNOSIS — M542 Cervicalgia: Secondary | ICD-10-CM | POA: Diagnosis not present

## 2021-10-20 DIAGNOSIS — M546 Pain in thoracic spine: Secondary | ICD-10-CM | POA: Diagnosis not present

## 2021-10-20 DIAGNOSIS — M5451 Vertebrogenic low back pain: Secondary | ICD-10-CM | POA: Diagnosis not present

## 2021-10-27 DIAGNOSIS — M546 Pain in thoracic spine: Secondary | ICD-10-CM | POA: Diagnosis not present

## 2021-10-27 DIAGNOSIS — M542 Cervicalgia: Secondary | ICD-10-CM | POA: Diagnosis not present

## 2021-10-27 DIAGNOSIS — M5451 Vertebrogenic low back pain: Secondary | ICD-10-CM | POA: Diagnosis not present

## 2021-11-17 DIAGNOSIS — M546 Pain in thoracic spine: Secondary | ICD-10-CM | POA: Diagnosis not present

## 2021-11-17 DIAGNOSIS — M542 Cervicalgia: Secondary | ICD-10-CM | POA: Diagnosis not present

## 2021-11-17 DIAGNOSIS — M5451 Vertebrogenic low back pain: Secondary | ICD-10-CM | POA: Diagnosis not present

## 2021-11-21 DIAGNOSIS — J029 Acute pharyngitis, unspecified: Secondary | ICD-10-CM | POA: Diagnosis not present

## 2021-11-21 DIAGNOSIS — R5383 Other fatigue: Secondary | ICD-10-CM | POA: Diagnosis not present

## 2021-11-21 DIAGNOSIS — R519 Headache, unspecified: Secondary | ICD-10-CM | POA: Diagnosis not present

## 2021-11-21 DIAGNOSIS — Z20822 Contact with and (suspected) exposure to covid-19: Secondary | ICD-10-CM | POA: Diagnosis not present

## 2022-01-20 DIAGNOSIS — R102 Pelvic and perineal pain: Secondary | ICD-10-CM | POA: Diagnosis not present

## 2022-01-20 DIAGNOSIS — D259 Leiomyoma of uterus, unspecified: Secondary | ICD-10-CM | POA: Diagnosis not present

## 2022-07-27 DIAGNOSIS — Z136 Encounter for screening for cardiovascular disorders: Secondary | ICD-10-CM | POA: Diagnosis not present

## 2022-07-27 DIAGNOSIS — Z01419 Encounter for gynecological examination (general) (routine) without abnormal findings: Secondary | ICD-10-CM | POA: Diagnosis not present

## 2022-07-27 DIAGNOSIS — D259 Leiomyoma of uterus, unspecified: Secondary | ICD-10-CM | POA: Diagnosis not present

## 2022-07-27 DIAGNOSIS — Z113 Encounter for screening for infections with a predominantly sexual mode of transmission: Secondary | ICD-10-CM | POA: Diagnosis not present

## 2023-01-11 DIAGNOSIS — D252 Subserosal leiomyoma of uterus: Secondary | ICD-10-CM | POA: Diagnosis not present

## 2023-01-11 DIAGNOSIS — D250 Submucous leiomyoma of uterus: Secondary | ICD-10-CM | POA: Diagnosis not present

## 2023-01-11 DIAGNOSIS — D259 Leiomyoma of uterus, unspecified: Secondary | ICD-10-CM | POA: Diagnosis not present

## 2023-07-06 ENCOUNTER — Ambulatory Visit: Payer: BC Managed Care – PPO | Admitting: Physician Assistant

## 2023-07-06 VITALS — BP 116/81 | HR 82 | Resp 16 | Ht 67.0 in | Wt 160.0 lb

## 2023-07-06 DIAGNOSIS — F411 Generalized anxiety disorder: Secondary | ICD-10-CM | POA: Diagnosis not present

## 2023-07-06 DIAGNOSIS — F5101 Primary insomnia: Secondary | ICD-10-CM | POA: Diagnosis not present

## 2023-07-06 DIAGNOSIS — F332 Major depressive disorder, recurrent severe without psychotic features: Secondary | ICD-10-CM | POA: Diagnosis not present

## 2023-07-06 MED ORDER — DESVENLAFAXINE SUCCINATE ER 50 MG PO TB24
50.0000 mg | ORAL_TABLET | Freq: Every day | ORAL | 1 refills | Status: DC
Start: 2023-07-06 — End: 2023-07-20

## 2023-07-06 MED ORDER — TRAZODONE HCL 50 MG PO TABS
25.0000 mg | ORAL_TABLET | Freq: Every evening | ORAL | 1 refills | Status: DC | PRN
Start: 2023-07-06 — End: 2023-07-29

## 2023-07-06 NOTE — Progress Notes (Signed)
 Established Patient Office Visit  Subjective   Patient ID: Kendra Boone, female    DOB: 03-20-1991  Age: 33 y.o. MRN: [id number]  Chief Complaint  Patient presents with   Medical Management of Chronic Issues    Pt would like to discuss mental health concerns and fmla paperwork    HPI Pt is a 33 yo female with history of depression and anxiety who presents to the clinic with worsening mood. Pt has been having trouble with depression, lack of motavation, crying, feeling hopeless for the last few months that seems to be worsening. She denies any thoughts of self harm or harm of others. 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 She is not sleeping well. She has trouble going to sleep and then once she goes to sleep she cannot wake up. She is not on any medication for mood. She was on wellbutrin in the past and did seem to help her mood but made her feel like she could blackout and kinda lightheaded.  She makes herself exercise 2-3 times a week. She is going to therapy. She feels desperate and wants to feel better.   .. Active Ambulatory Problems    Diagnosis Date Noted   Vitamin D  deficiency 08/12/2015   Serum sodium decreased 08/12/2015   Low iron stores 08/12/2015   Thyromegaly 08/12/2015   Trouble swallowing 08/12/2015   Dry mouth 08/12/2015   Generalized edema 08/12/2015   Bilateral hip pain 08/12/2015   Abnormal weight gain 08/12/2015   Depression 08/12/2015   Arthralgia of both lower legs 08/12/2015   Vaginal discharge 08/12/2015   Peroneal tendinitis of left lower extremity 02/13/2017   Palpitations 10/11/2017   Elevated blood pressure reading 09/05/2018   Stress 09/05/2018   Primary insomnia 09/05/2018   Decreased thyroid  stimulating hormone (TSH) level 04/21/2019   Class 1 obesity due to excess calories without serious comorbidity with body mass index (BMI) of 32.0 to 32.9 in  adult 04/24/2019   Positive depression screening 04/24/2019   Anxiety and depression 06/05/2020   Bacterial vaginosis 06/05/2020   Irritable bowel syndrome with diarrhea 09/27/2020   Hair loss 09/27/2020   Low maternal serum vitamin B12 10/07/2020   GAD (generalized anxiety disorder) 07/06/2023   Resolved Ambulatory Problems    Diagnosis Date Noted   No Resolved Ambulatory Problems   No Additional Past Medical History     ROS   See HPI.  Objective:     BP 116/81   Pulse 82   Resp 16   Ht 5' 7 (1.702 m)   Wt 160 lb (72.6 kg)   SpO2 99%   BMI 25.06 kg/m  BP Readings from Last 3 Encounters:  07/06/23 116/81  06/05/20 123/81  05/28/20 107/77   Wt Readings from Last 3 Encounters:  07/06/23 160 lb (72.6 kg)  09/27/20 185 lb (83.9 kg)  07/30/20 185 lb (83.9 kg)   ..    07/06/2023   11:56 AM 07/30/2020    1:31 PM 05/28/2020   10:30 AM 04/19/2019    1:21 PM 10/11/2017   10:59 AM  Depression screen PHQ 2/9  Decreased Interest 3 0 1 1 1   Down, Depressed, Hopeless 3 1 1 1 1   PHQ - 2 Score 6 1 2 2 2   Altered sleeping 3 3 3 3 2   Tired, decreased energy 3 3 2 2 3   Change in appetite  3 2 1 2 1   Feeling bad or failure about yourself  3 0 0 0 0  Trouble concentrating 3 3 0 1 0  Moving slowly or fidgety/restless 1 2 0 0 0  Suicidal thoughts 0 0 0 0 0  PHQ-9 Score 22 14 8 10 8   Difficult doing work/chores Extremely dIfficult Very difficult Somewhat difficult Somewhat difficult Somewhat difficult   ..    07/06/2023   11:57 AM 07/30/2020    1:33 PM 05/28/2020   10:30 AM 04/19/2019    1:21 PM  GAD 7 : Generalized Anxiety Score  Nervous, Anxious, on Edge 3 3 3 3   Control/stop worrying 3 2 1 2   Worry too much - different things 3 2 1 2   Trouble relaxing 3 2 3 2   Restless 1 1 1  0  Easily annoyed or irritable 1 1 1 1   Afraid - awful might happen 0 0 0 0  Total GAD 7 Score 14 11 10 10   Anxiety Difficulty Extremely difficult Very difficult Somewhat difficult Somewhat  difficult        Physical Exam HENT:     Head: Normocephalic.  Cardiovascular:     Rate and Rhythm: Normal rate.  Pulmonary:     Effort: Pulmonary effort is normal.  Neurological:     General: No focal deficit present.     Mental Status: She is alert and oriented to person, place, and time.  Psychiatric:     Comments: Flat affect          Assessment & Plan:  Kendra Boone was seen today for medical management of chronic issues.  Diagnoses and all orders for this visit:  Severe episode of recurrent major depressive disorder, without psychotic features (HCC) -     desvenlafaxine  (PRISTIQ ) 50 MG 24 hr tablet; Take 1 tablet (50 mg total) by mouth daily. -     Ambulatory referral to Psychiatry  GAD (generalized anxiety disorder) -     desvenlafaxine  (PRISTIQ ) 50 MG 24 hr tablet; Take 1 tablet (50 mg total) by mouth daily. -     Ambulatory referral to Psychiatry  Primary insomnia -     traZODone  (DESYREL ) 50 MG tablet; Take 0.5-1 tablets (25-50 mg total) by mouth at bedtime as needed for sleep. -     Ambulatory referral to Psychiatry   PHQ/GAD not to goal Discussed 988 if started to having thoughts of hurting yourself or others Continue with therapy and increase frequency to at least once a week Continue to work on exercise and daily meditation and walking Start pristiq  Failed wellbutrin and unknown SSRI Start trazodone  for sleep Referral placed for [facility] Written out of work for 3 weeks for stabilization Follow up in 2 weeks  Spent 30 minutes in chart review and discussing plan for patient and symptomatic control of symptoms.    Return in about 2 weeks (around 07/20/2023).    Jarrius Huaracha, PA-C

## 2023-07-06 NOTE — Patient Instructions (Addendum)
 Trazodone at bedtime Pristiq to start for mood Continue therapy and regular exercise Out of work for 3 weeks for stablization

## 2023-07-07 ENCOUNTER — Telehealth: Payer: Self-pay | Admitting: Physician Assistant

## 2023-07-07 ENCOUNTER — Encounter: Payer: Self-pay | Admitting: Physician Assistant

## 2023-07-07 NOTE — Telephone Encounter (Signed)
 Copied from CRM [id number]. Topic: General - Other >> Jul 06, 2023 12:15 PM Leotis ORN wrote: Reason for CRM: Pt is needing an updated work excuse, needs to be a little more detailed as far as what the dates mean , she would like to come by and pick it up if not she would like it uploaded in [employer]

## 2023-07-16 ENCOUNTER — Telehealth: Payer: Self-pay | Admitting: *Deleted

## 2023-07-16 NOTE — Telephone Encounter (Signed)
Called pt and LVM advising her that I would need a fax# to send her FMLA forms  to. Advised her that she could either call or send via my chart.

## 2023-07-19 NOTE — Telephone Encounter (Signed)
Pt sent a my chart with address for the FMLA forms to be sent to   [facility] ATTN: Okey Dupre [address] [place], [address]  Form copied and sent to scan

## 2023-07-20 ENCOUNTER — Encounter: Payer: Self-pay | Admitting: Physician Assistant

## 2023-07-20 ENCOUNTER — Telehealth (INDEPENDENT_AMBULATORY_CARE_PROVIDER_SITE_OTHER): Payer: BC Managed Care – PPO | Admitting: Physician Assistant

## 2023-07-20 DIAGNOSIS — F411 Generalized anxiety disorder: Secondary | ICD-10-CM

## 2023-07-20 DIAGNOSIS — F332 Major depressive disorder, recurrent severe without psychotic features: Secondary | ICD-10-CM | POA: Diagnosis not present

## 2023-07-20 DIAGNOSIS — F5101 Primary insomnia: Secondary | ICD-10-CM

## 2023-07-20 MED ORDER — VILAZODONE HCL 40 MG PO TABS: ORAL_TABLET | ORAL | 0 refills | Status: DC

## 2023-07-20 NOTE — Progress Notes (Signed)
..Virtual Visit via Video Note  I connected with Kendra Boone on 07/20/23 at  2:20 PM EST by a video enabled telemedicine application and verified that I am speaking with the correct person using two identifiers.  Location: Patient: home Provider: clinic  .Marland KitchenParticipating in visit:  Patient: Kendra Boone Provider: Tandy Gaw PA-C Provider in training: Gareth Eagle PA-S   I discussed the limitations of evaluation and management by telemedicine and the availability of in person appointments. The patient expressed understanding and agreed to proceed.  History of Present Illness: Pt is a 33 yo female who calls into the clinic to follow up on severe depression, anxiety, insomnia. She started pristiq and trazodone. Trazodone has helped with sleeping but pristiq has not helped at all and in fact caused more irritation and emotion. She does not want to continue to this. She remains in therapy weekly. She continues to be out of work.    .. Active Ambulatory Problems    Diagnosis Date Noted   Vitamin D deficiency 08/12/2015   Serum sodium decreased 08/12/2015   Low iron stores 08/12/2015   Thyromegaly 08/12/2015   Trouble swallowing 08/12/2015   Dry mouth 08/12/2015   Generalized edema 08/12/2015   Bilateral hip pain 08/12/2015   Abnormal weight gain 08/12/2015   Depression 08/12/2015   Arthralgia of both lower legs 08/12/2015   Vaginal discharge 08/12/2015   Peroneal tendinitis of left lower extremity 02/13/2017   Palpitations 10/11/2017   Elevated blood pressure reading 09/05/2018   Stress 09/05/2018   Primary insomnia 09/05/2018   Decreased thyroid stimulating hormone (TSH) level 04/21/2019   Class 1 obesity due to excess calories without serious comorbidity with body mass index (BMI) of 32.0 to 32.9 in adult 04/24/2019   Positive depression screening 04/24/2019   Anxiety and depression 06/05/2020   Bacterial vaginosis 06/05/2020   Irritable bowel syndrome with diarrhea  09/27/2020   Hair loss 09/27/2020   Low maternal serum vitamin B12 10/07/2020   GAD (generalized anxiety disorder) 07/06/2023   Resolved Ambulatory Problems    Diagnosis Date Noted   No Resolved Ambulatory Problems   No Additional Past Medical History       Observations/Objective: No acute distress  Normal breathing Flat affect  ..    07/20/2023    2:26 PM 07/06/2023   11:56 AM 07/30/2020    1:31 PM 05/28/2020   10:30 AM 04/19/2019    1:21 PM  Depression screen PHQ 2/9  Decreased Interest 3 3 0 1 1  Down, Depressed, Hopeless 3 3 1 1 1   PHQ - 2 Score 6 6 1 2 2   Altered sleeping 1 3 3 3 3   Tired, decreased energy 3 3 3 2 2   Change in appetite 3 3 2 1 2   Feeling bad or failure about yourself  3 3 0 0 0  Trouble concentrating 3 3 3  0 1  Moving slowly or fidgety/restless 3 1 2  0 0  Suicidal thoughts 1 0 0 0 0  PHQ-9 Score 23 22 14 8 10   Difficult doing work/chores Very difficult Extremely dIfficult Very difficult Somewhat difficult Somewhat difficult   ..    07/20/2023    2:27 PM 07/06/2023   11:57 AM 07/30/2020    1:33 PM 05/28/2020   10:30 AM  GAD 7 : Generalized Anxiety Score  Nervous, Anxious, on Edge 3 3 3 3   Control/stop worrying 3 3 2 1   Worry too much - different things 3 3 2 1   Trouble relaxing 3  3 2 3   Restless 3 1 1 1   Easily annoyed or irritable 3 1 1 1   Afraid - awful might happen 3 0 0 0  Total GAD 7 Score 21 14 11 10   Anxiety Difficulty Very difficult Extremely difficult Very difficult Somewhat difficult     Assessment and Plan: Marland KitchenMarland KitchenDiagnoses and all orders for this visit:  Severe episode of recurrent major depressive disorder, without psychotic features (HCC) -     Vilazodone HCl (VIIBRYD) 40 MG TABS; Take one-half tablet for 7 days then increase to one full tablet.  GAD (generalized anxiety disorder) -     Vilazodone HCl (VIIBRYD) 40 MG TABS; Take one-half tablet for 7 days then increase to one full tablet.  Primary insomnia   PHQ/GAD with no  improvement Stop pristiq Start viibryd Failed Wellbutrin and SSRI and now SSNRI Continue trazodone for sleep Extended work absence to go back 08/04/2023 Gave number to [facility] to reach out to get scheduled Follow up in 2 weeks    Follow Up Instructions:    I discussed the assessment and treatment plan with the patient. The patient was provided an opportunity to ask questions and all were answered. The patient agreed with the plan and demonstrated an understanding of the instructions.   The patient was advised to call back or seek an in-person evaluation if the symptoms worsen or if the condition fails to improve as anticipated.    Tandy Gaw, PA-C

## 2023-07-27 DIAGNOSIS — N760 Acute vaginitis: Secondary | ICD-10-CM | POA: Diagnosis not present

## 2023-07-27 DIAGNOSIS — R300 Dysuria: Secondary | ICD-10-CM | POA: Diagnosis not present

## 2023-07-27 DIAGNOSIS — Z113 Encounter for screening for infections with a predominantly sexual mode of transmission: Secondary | ICD-10-CM | POA: Diagnosis not present

## 2023-07-28 ENCOUNTER — Other Ambulatory Visit: Payer: Self-pay | Admitting: Physician Assistant

## 2023-07-28 DIAGNOSIS — F5101 Primary insomnia: Secondary | ICD-10-CM

## 2023-08-06 NOTE — Telephone Encounter (Signed)
 Contacted patient and scheduled an appointment for 08/10/2023 at 1:20 pm with Jade.

## 2023-08-10 ENCOUNTER — Encounter: Payer: Self-pay | Admitting: Physician Assistant

## 2023-08-10 ENCOUNTER — Ambulatory Visit: Payer: BC Managed Care – PPO | Admitting: Physician Assistant

## 2023-08-10 VITALS — BP 120/80 | HR 70 | Ht 67.0 in | Wt 155.0 lb

## 2023-08-10 DIAGNOSIS — F411 Generalized anxiety disorder: Secondary | ICD-10-CM | POA: Diagnosis not present

## 2023-08-10 DIAGNOSIS — F5101 Primary insomnia: Secondary | ICD-10-CM

## 2023-08-10 DIAGNOSIS — F332 Major depressive disorder, recurrent severe without psychotic features: Secondary | ICD-10-CM

## 2023-08-10 MED ORDER — VORTIOXETINE HBR 5 MG PO TABS: 5.0000 mg | ORAL_TABLET | Freq: Every day | ORAL | 0 refills | Status: AC

## 2023-08-10 NOTE — Progress Notes (Signed)
Established Patient Office Visit  Subjective   Patient ID: Kendra Boone, female    DOB: 07-Oct-1990  Age: 33 y.o. MRN: [id number]  Chief Complaint  Patient presents with   Medical Management of Chronic Issues    Mood fup    HPI Pt is a 33 yo female who presents to the clinice for MDD, GAD, insomnia. She has been on vibryd for 3 weeks and not helped. In fact it is caused her to have loose stools and sleep worse. She takes it in the morning and still has trouble falling asleep at night. She continues to go to therapy once a week. Not had any luck scheduling appt with [facility] in [place]. She continues to not be able to concentrate or be motivated to go to work. Her anxiety and depression are worse. No SI/HC.    Active Ambulatory Problems    Diagnosis Date Noted   Vitamin D deficiency 08/12/2015   Serum sodium decreased 08/12/2015   Low iron stores 08/12/2015   Thyromegaly 08/12/2015   Trouble swallowing 08/12/2015   Dry mouth 08/12/2015   Generalized edema 08/12/2015   Bilateral hip pain 08/12/2015   Abnormal weight gain 08/12/2015   Depression 08/12/2015   Arthralgia of both lower legs 08/12/2015   Vaginal discharge 08/12/2015   Peroneal tendinitis of left lower extremity 02/13/2017   Palpitations 10/11/2017   Elevated blood pressure reading 09/05/2018   Stress 09/05/2018   Primary insomnia 09/05/2018   Decreased thyroid stimulating hormone (TSH) level 04/21/2019   Class 1 obesity due to excess calories without serious comorbidity with body mass index (BMI) of 32.0 to 32.9 in adult 04/24/2019   Positive depression screening 04/24/2019   Anxiety and depression 06/05/2020   Bacterial vaginosis 06/05/2020   Irritable bowel syndrome with diarrhea 09/27/2020   Hair loss 09/27/2020   Low maternal serum vitamin B12 10/07/2020   GAD (generalized anxiety disorder) 07/06/2023   Resolved Ambulatory Problems    Diagnosis Date Noted   No Resolved Ambulatory Problems   No  Additional Past Medical History     ROS See HPI.    Objective:     BP 120/80   Pulse 70   Ht 5\' 7"  (1.702 m)   Wt 155 lb (70.3 kg)   SpO2 99%   BMI 24.28 kg/m  BP Readings from Last 3 Encounters:  08/10/23 120/80  07/06/23 116/81  06/05/20 123/81   Wt Readings from Last 3 Encounters:  08/10/23 155 lb (70.3 kg)  07/06/23 160 lb (72.6 kg)  09/27/20 185 lb (83.9 kg)   ..    08/10/2023    1:53 PM 07/20/2023    2:26 PM 07/06/2023   11:56 AM 07/30/2020    1:31 PM 05/28/2020   10:30 AM  Depression screen PHQ 2/9  Decreased Interest 2 3 3  0 1  Down, Depressed, Hopeless 2 3 3 1 1   PHQ - 2 Score 4 6 6 1 2   Altered sleeping 1 1 3 3 3   Tired, decreased energy 2 3 3 3 2   Change in appetite 1 3 3 2 1   Feeling bad or failure about yourself  0 3 3 0 0  Trouble concentrating 1 3 3 3  0  Moving slowly or fidgety/restless 0 3 1 2  0  Suicidal thoughts 0 1 0 0 0  PHQ-9 Score 9 23 22 14 8   Difficult doing work/chores Very difficult Very difficult Extremely dIfficult Very difficult Somewhat difficult   ..    07/20/2023  2:27 PM 07/06/2023   11:57 AM 07/30/2020    1:33 PM 05/28/2020   10:30 AM  GAD 7 : Generalized Anxiety Score  Nervous, Anxious, on Edge 3 3 3 3   Control/stop worrying 3 3 2 1   Worry too much - different things 3 3 2 1   Trouble relaxing 3 3 2 3   Restless 3 1 1 1   Easily annoyed or irritable 3 1 1 1   Afraid - awful might happen 3 0 0 0  Total GAD 7 Score 21 14 11 10   Anxiety Difficulty Very difficult Extremely difficult Very difficult Somewhat difficult        Physical Exam Constitutional:      Appearance: Normal appearance.  HENT:     Head: Normocephalic.  Cardiovascular:     Rate and Rhythm: Normal rate and regular rhythm.  Pulmonary:     Effort: Pulmonary effort is normal.  Neurological:     Mental Status: She is alert.  Psychiatric:     Comments: Flat affect        Assessment & Plan:  Marland KitchenMarland KitchenDontrice was seen today for medical management of  chronic issues.  Diagnoses and all orders for this visit:  Severe episode of recurrent major depressive disorder, without psychotic features (HCC) -     vortioxetine HBr (TRINTELLIX) 5 MG TABS tablet; Take 1 tablet (5 mg total) by mouth daily.  GAD (generalized anxiety disorder) -     vortioxetine HBr (TRINTELLIX) 5 MG TABS tablet; Take 1 tablet (5 mg total) by mouth daily.  Primary insomnia   Pt has not had success with [facility] that she was referred to in [place].  She is going to research and let me know where to send referral Failed vibryd. GAD and PHQ worsening.  Trial of Trintellix Continue trazodone for sleep.  Written out of work for the next 4 weeks until she can get into [facility].    Tandy Gaw, PA-C

## 2023-08-16 ENCOUNTER — Other Ambulatory Visit: Payer: Self-pay | Admitting: Physician Assistant

## 2023-08-16 DIAGNOSIS — F332 Major depressive disorder, recurrent severe without psychotic features: Secondary | ICD-10-CM

## 2023-08-16 DIAGNOSIS — F411 Generalized anxiety disorder: Secondary | ICD-10-CM
